# Patient Record
Sex: Male | Born: 1985
Health system: Southern US, Community
[De-identification: ages and names within clinical notes are randomized; demographics above are authoritative.]

## PROBLEM LIST (undated history)

## (undated) ENCOUNTER — Emergency Department: Admission: EM | Payer: 59 | Source: Home / Self Care

## (undated) DIAGNOSIS — I10 Essential (primary) hypertension: Secondary | ICD-10-CM

## (undated) DIAGNOSIS — N390 Urinary tract infection, site not specified: Secondary | ICD-10-CM

## (undated) HISTORY — PX: CYSTOSCOPY: SUR368

---

## 2010-02-07 ENCOUNTER — Emergency Department (HOSPITAL_COMMUNITY)
Admission: EM | Admit: 2010-02-07 | Discharge: 2010-02-07 | Payer: Self-pay | Source: Home / Self Care | Admitting: Family Medicine

## 2010-03-22 ENCOUNTER — Other Ambulatory Visit (HOSPITAL_COMMUNITY): Payer: Self-pay | Admitting: Urology

## 2010-03-24 ENCOUNTER — Ambulatory Visit (HOSPITAL_COMMUNITY)
Admission: RE | Admit: 2010-03-24 | Discharge: 2010-03-24 | Disposition: A | Discharge status: Home / Self Care | Payer: Self-pay | Source: Ambulatory Visit | Attending: Urology | Admitting: Urology

## 2010-03-24 ENCOUNTER — Encounter (HOSPITAL_COMMUNITY): Payer: Self-pay

## 2010-03-24 DIAGNOSIS — Z87442 Personal history of urinary calculi: Secondary | ICD-10-CM | POA: Insufficient documentation

## 2010-03-24 DIAGNOSIS — N281 Cyst of kidney, acquired: Secondary | ICD-10-CM | POA: Insufficient documentation

## 2010-03-24 DIAGNOSIS — R109 Unspecified abdominal pain: Secondary | ICD-10-CM | POA: Insufficient documentation

## 2010-03-24 DIAGNOSIS — R339 Retention of urine, unspecified: Secondary | ICD-10-CM | POA: Insufficient documentation

## 2010-03-24 DIAGNOSIS — R319 Hematuria, unspecified: Secondary | ICD-10-CM | POA: Insufficient documentation

## 2010-03-24 MED ORDER — IOHEXOL 300 MG/ML  SOLN
125.0000 mL | Freq: Once | INTRAMUSCULAR | Status: AC | PRN
  Administered 2010-03-24: 125 mL via INTRAVENOUS

## 2010-04-19 ENCOUNTER — Other Ambulatory Visit (HOSPITAL_COMMUNITY): Payer: Self-pay

## 2010-04-21 ENCOUNTER — Encounter (HOSPITAL_COMMUNITY): Payer: Self-pay | Attending: Urology

## 2010-04-21 ENCOUNTER — Other Ambulatory Visit: Payer: Self-pay | Admitting: Urology

## 2010-04-21 DIAGNOSIS — Z01812 Encounter for preprocedural laboratory examination: Secondary | ICD-10-CM | POA: Insufficient documentation

## 2010-04-21 LAB — CBC
HCT: 42.7 % (ref 39.0–52.0)
Hemoglobin: 14.1 g/dL (ref 13.0–17.0)
MCH: 30.5 pg (ref 26.0–34.0)
MCHC: 33 g/dL (ref 30.0–36.0)
MCV: 92.2 fL (ref 78.0–100.0)

## 2010-04-23 ENCOUNTER — Ambulatory Visit (HOSPITAL_COMMUNITY): Payer: Self-pay

## 2010-04-23 ENCOUNTER — Ambulatory Visit (HOSPITAL_COMMUNITY)
Admission: RE | Admit: 2010-04-23 | Discharge: 2010-04-24 | Disposition: A | Discharge status: Home / Self Care | Payer: Self-pay | Source: Ambulatory Visit | Attending: Urology | Admitting: Urology

## 2010-04-23 ENCOUNTER — Other Ambulatory Visit: Payer: Self-pay | Admitting: Urology

## 2010-04-23 DIAGNOSIS — J9600 Acute respiratory failure, unspecified whether with hypoxia or hypercapnia: Secondary | ICD-10-CM | POA: Insufficient documentation

## 2010-04-23 DIAGNOSIS — Z23 Encounter for immunization: Secondary | ICD-10-CM | POA: Insufficient documentation

## 2010-04-23 DIAGNOSIS — R310 Gross hematuria: Secondary | ICD-10-CM | POA: Insufficient documentation

## 2010-04-23 DIAGNOSIS — J810 Acute pulmonary edema: Secondary | ICD-10-CM

## 2010-04-23 DIAGNOSIS — G4733 Obstructive sleep apnea (adult) (pediatric): Secondary | ICD-10-CM

## 2010-04-23 DIAGNOSIS — N281 Cyst of kidney, acquired: Secondary | ICD-10-CM | POA: Insufficient documentation

## 2010-04-23 DIAGNOSIS — R042 Hemoptysis: Secondary | ICD-10-CM | POA: Insufficient documentation

## 2010-04-23 DIAGNOSIS — N35919 Unspecified urethral stricture, male, unspecified site: Secondary | ICD-10-CM | POA: Insufficient documentation

## 2010-04-23 DIAGNOSIS — Z01812 Encounter for preprocedural laboratory examination: Secondary | ICD-10-CM | POA: Insufficient documentation

## 2010-04-23 DIAGNOSIS — R39198 Other difficulties with micturition: Secondary | ICD-10-CM | POA: Insufficient documentation

## 2010-04-23 LAB — CBC
HCT: 41.2 % (ref 39.0–52.0)
Hemoglobin: 13.9 g/dL (ref 13.0–17.0)
MCH: 30.8 pg (ref 26.0–34.0)
MCHC: 33.7 g/dL (ref 30.0–36.0)
MCV: 91.2 fL (ref 78.0–100.0)

## 2010-04-23 LAB — BASIC METABOLIC PANEL
BUN: 9 mg/dL (ref 6–23)
CO2: 30 mEq/L (ref 19–32)
Chloride: 101 mEq/L (ref 96–112)
Creatinine, Ser: 1.03 mg/dL (ref 0.4–1.5)
GFR calc Af Amer: >60 mL/min (ref >60)

## 2010-04-24 ENCOUNTER — Inpatient Hospital Stay (HOSPITAL_COMMUNITY): Payer: Self-pay

## 2010-04-24 LAB — BASIC METABOLIC PANEL
CO2: 30 mEq/L (ref 19–32)
Calcium: 9.1 mg/dL (ref 8.4–10.5)
Glucose, Bld: 111 mg/dL — ABNORMAL HIGH (ref 70–99)
Sodium: 136 mEq/L (ref 135–145)

## 2010-04-27 NOTE — Op Note (Signed)
  Jerry Butler, Jerry Butler              ACCOUNT NO.:  [id number]  MEDICAL RECORD NO.:  [id number]           PATIENT TYPE:  I  LOCATION:  1421                         FACILITY:  [facility]  PHYSICIAN:  Maretta Bees. Vonita Moss, M.D.DATE OF BIRTH:  Nov 18, 1985  DATE OF PROCEDURE:  04/23/2010 DATE OF DISCHARGE:                              OPERATIVE REPORT   PREOPERATIVE DIAGNOSIS:  Recurrent gross hematuria/urethral bleeding and rule out urethral stricture.  POSTOPERATIVE DIAGNOSIS:  Severe bulbous urethral stricture.  PROCEDURES: 1. Cystoscopy. 2. Urethral mucosal biopsy. 3. Balloon dilation of severe deep bulbous urethral stricture. 4. Placement of Foley catheter.  SURGEON:  Maretta Bees. Vonita Moss, M.D.  ANESTHESIA:  General.  INDICATIONS:  This gentleman has had recurrent initial and terminal hematuria with clots and long history of very small slow urinary stream. He had undergone workup with upper tract studies which showed no significant upper tract lesions contributing to his hematuria.  He did have a benign left renal cyst.  He was told about possibility of a urethral stricture.  He was brought to OR for further evaluation.  DESCRIPTION OF PROCEDURE:  The patient was brought to the operating room and placed in lithotomy position.  External genitalia were prepped and draped in usual fashion.  He was cystoscoped.  Anterior urethra was normal except for mild hypospadias until I reached the deep bulbous urethra and there was no obvious urethral opening.  It almost appeared blind ending and there was some bleeding from this area before I reached it with some mucosal tag from the edge of the urethra.  I took a cold cup bladder biopsy forceps and removed the small mucosal tag for biopsy. I still could not identify obvious urethral opening but with the use of a sensor wire I was able to pass this wire into the bladder.  I used fluoroscopy and confirmed good positioning of the wire and then over  the wire inserted a 5-French open-ended ureteral catheter and injected contrast in what appeared to be a trabeculated bladder.  I then used the balloon dilating equipment to dilate the severe stricture for 3 minutes. I then inserted the flexible cystoscope and the strictured area was 1-2 cm in length.  It was just beyond the external sphincter.  The prostate was small with no obstruction.  The bladder had trabeculation but no intravesical lesions.  With the guidewire still in place, I then inserted a 16-French Council tip Foley catheter and connected to closed drainage after draining clear irrigation from the bladder.  He was taken to recovery room with minimal blood loss having tolerated the procedure well.  He was given Toradol and a B and O suppository at the end of the case.  I wonder whether he had a straddle injury as a child that might account for this severe stricture.     Maretta Bees. Vonita Moss, M.D.     LJP/MEDQ  D:  04/23/2010  T:  04/23/2010  Job:  [id number]  Electronically Signed by Larey Dresser M.D. on 04/27/2010 08:14:09 PM

## 2010-05-06 ENCOUNTER — Inpatient Hospital Stay: Payer: Self-pay | Admitting: Pulmonary Disease

## 2010-05-14 NOTE — Discharge Summary (Signed)
  NAMEANDERSEN, IORIO              ACCOUNT NO.:  [id number]  MEDICAL RECORD NO.:  [id number]           PATIENT TYPE:  O  LOCATION:  1421                         FACILITY:  [facility]  PHYSICIAN:  Maretta Bees. Vonita Moss, M.D.DATE OF BIRTH:  September 27, 1985  DATE OF ADMISSION:  04/23/2010 DATE OF DISCHARGE:  04/24/2010                              DISCHARGE SUMMARY   FINAL DIAGNOSES: 1. Severe urethral stricture. 2. Acute pulmonary dysfunction with possible aspiration.  PROCEDURE:  Cystoscopy and balloon dilation of severe urethral stricture.  HISTORY:  This gentleman has had some hematuria in a very slow urinary stream.  He was brought to the OR for further evaluation.  He was found to have a very tight urethral stricture and underwent balloon dilation. Postoperatively in the recovery room, he had some coughing and pulmonary dysfunction.  He was seen by the pulmonologist to watch him overnight and his pulmonary problems cleared and he was ready for discharge the next day.  He was sent home in good condition.  He will return in the office in 1-2 weeks in followup.  He can resume regular diet and activity.     Maretta Bees. Vonita Moss, M.D.     LJP/MEDQ  D:  05/13/2010  T:  05/14/2010  Job:  [id number]  Electronically Signed by Larey Dresser M.D. on 05/14/2010 08:45:59 AM

## 2012-09-06 IMAGING — CT CT ABD-PEL WO/W CM
2 of 7 series · 14 of 46 positions shown, 19 images · IV contrast (agent unspecified)
Comparison: None.

CLINICAL DATA: Hematuria for 2 months.  For urinary retention.
History of stones seen on the renal ultrasound with a cyst in right
kidney.

CT ABDOMEN AND PELVIS WITHOUT AND WITH CONTRAST
TECHNIQUE: Multidetector CT imaging of the abdomen and pelvis was
performed without contrast material in one or both body regions,
followed by contrast material(s) and further sections in one or
both body regions.
Contrast: 125 ml [id number]-S88

[Series 5: rtn ap with st · axial · 0.74mm/px · z∈[-451,-31]mm · 11 of 100 slices shown, 16 images]
[im 8/100  soft-tissue]
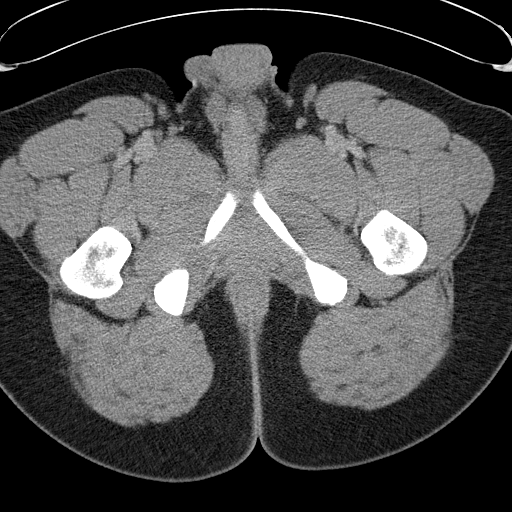
[im 8/100  bone]
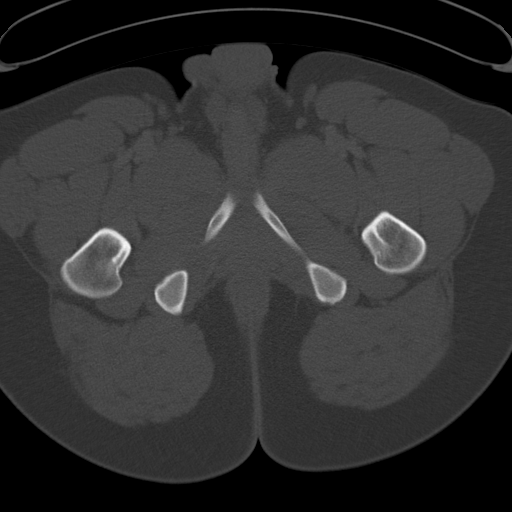
[im 15/100  soft-tissue]
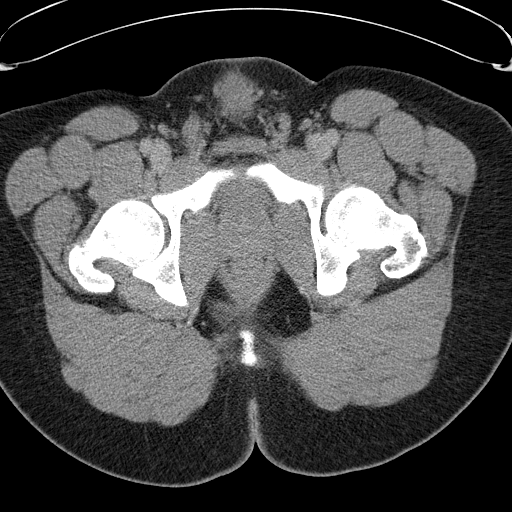
[im 29/100  soft-tissue]
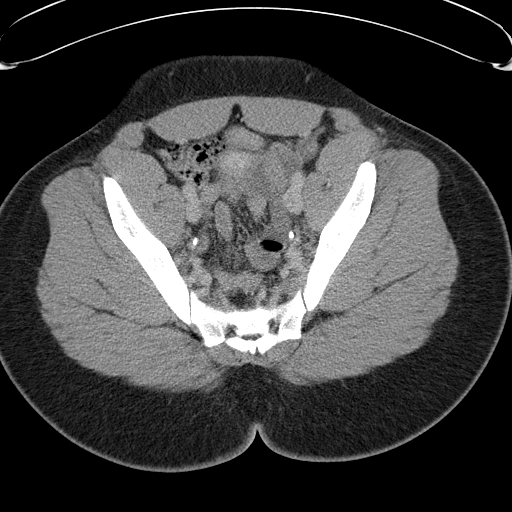
[im 36/100  soft-tissue]
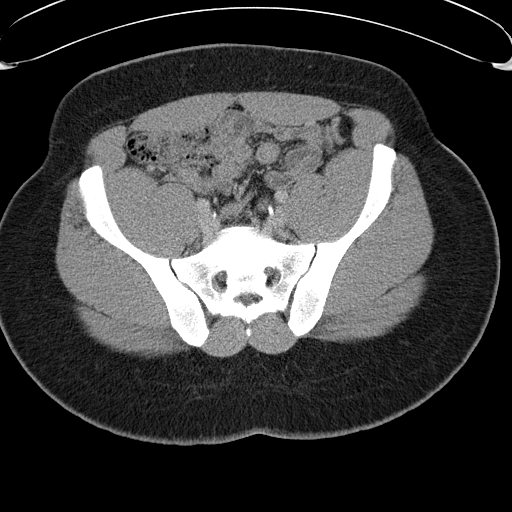
[im 43/100  soft-tissue]
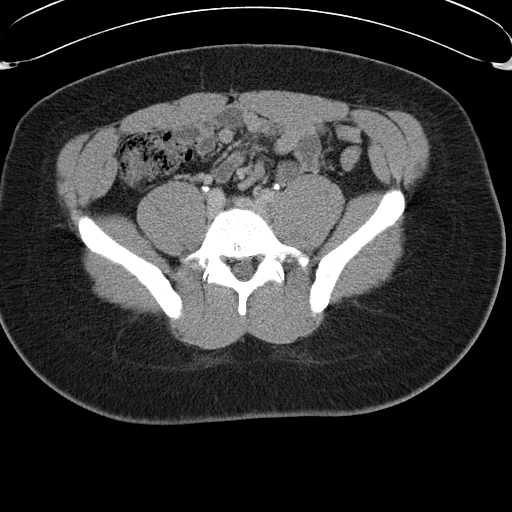
[im 57/100  soft-tissue]
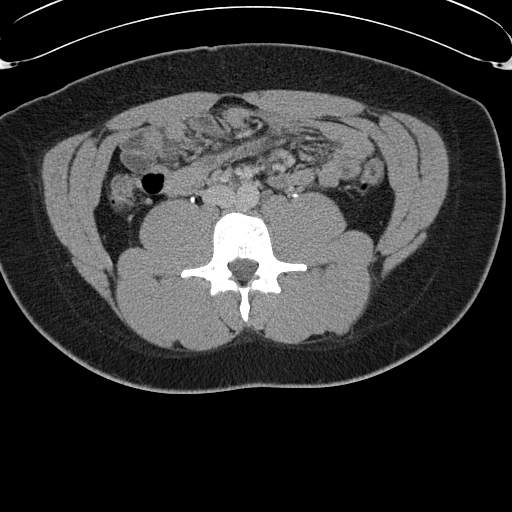
[im 64/100  soft-tissue]
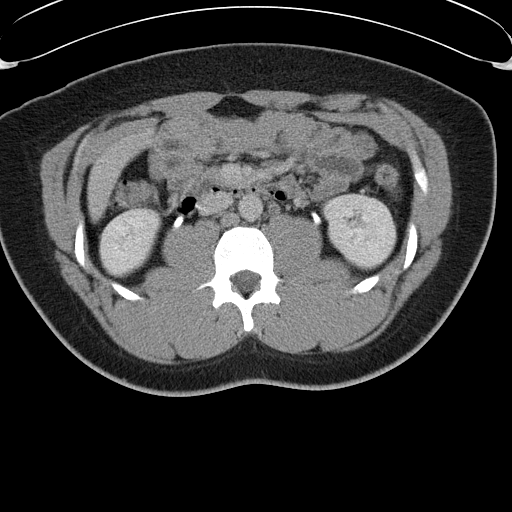
[im 71/100  soft-tissue]
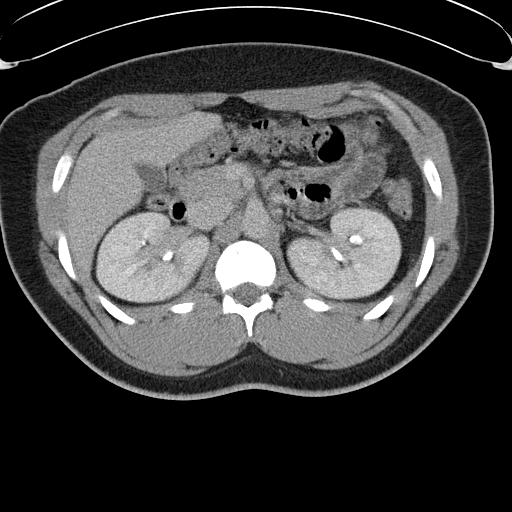
[im 71/100  lung]
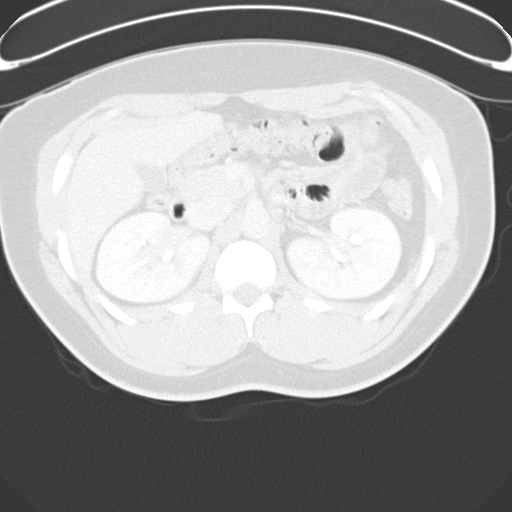
[im 78/100  lung]
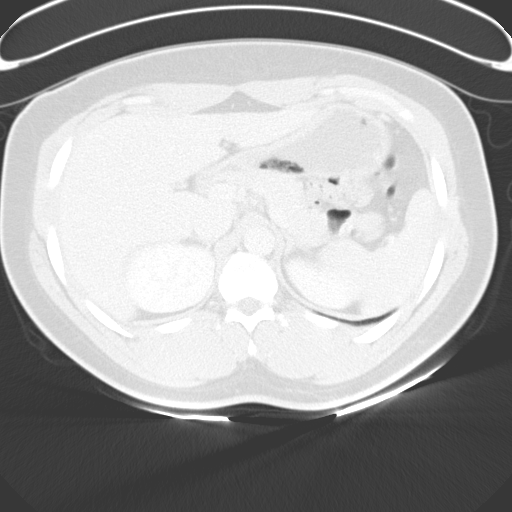
[im 85/100  soft-tissue]
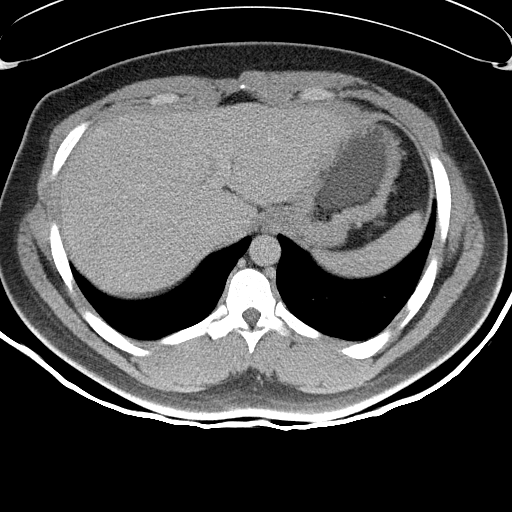
[im 85/100  lung]
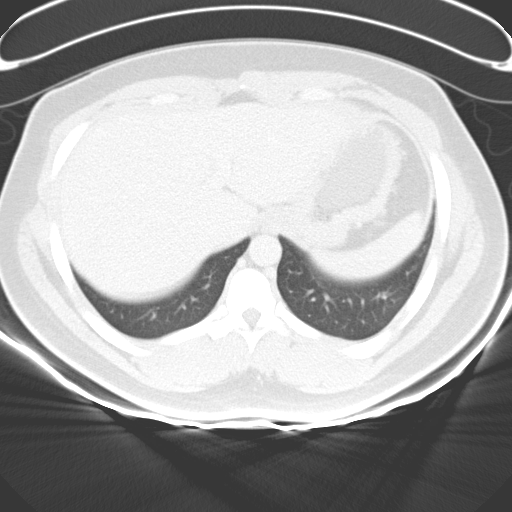
[im 85/100  bone]
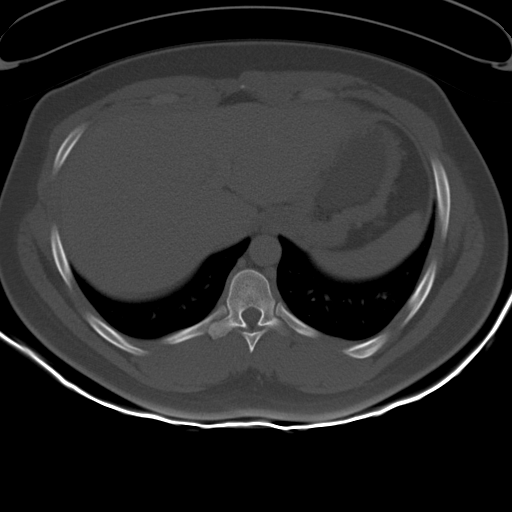
[im 92/100  soft-tissue]
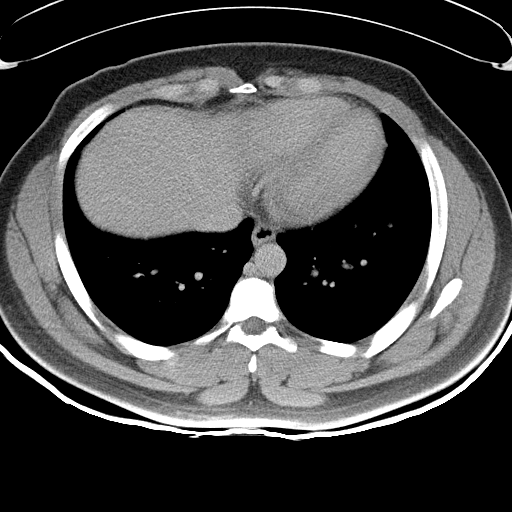
[im 92/100  lung]
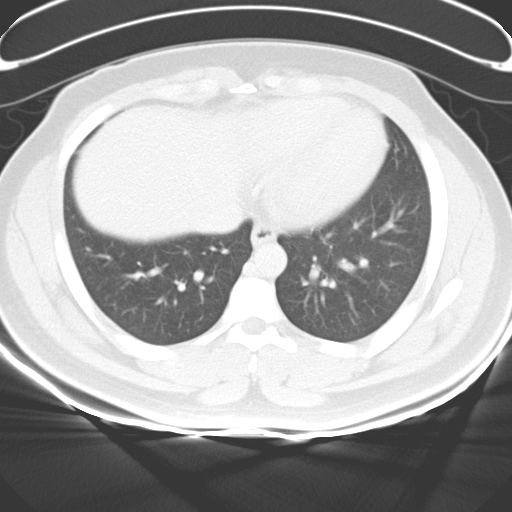

[Series 602: pre coronal · coronal · non-contrast · 0.93mm/px · 3 of 96 slices shown]
[im 24/96  soft-tissue]
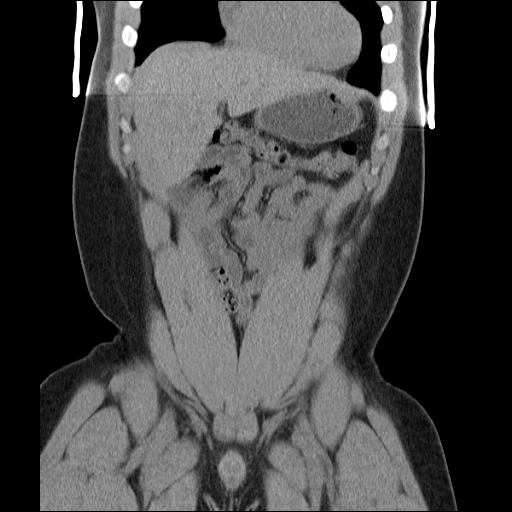
[im 48/96  soft-tissue]
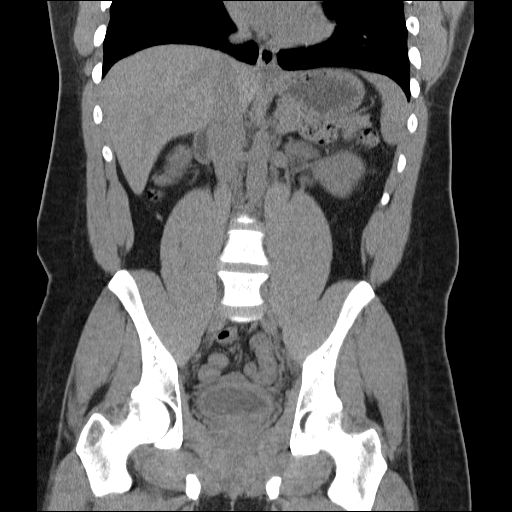
[im 72/96  soft-tissue]
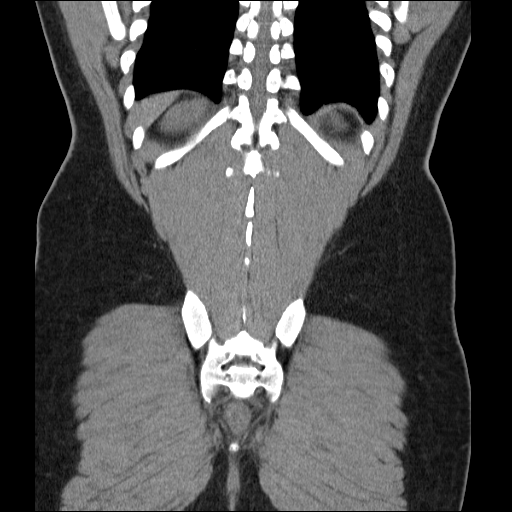

[14 of 46 positions shown; findings below may reference images not displayed]

FINDINGS: Unenhanced images demonstrate no urinary tract calculi or
hydronephrosis/hydroureter.

Postcontrast images demonstrate clear lung bases.  Heart size upper
limits of normal without pericardial or pleural effusions.  Normal
liver, spleen, stomach, pancreas, gallbladder, biliary tract,
adrenal glands.  Interpolar left renal cyst.  Normal right kidney.

Good contrast opacification of the bilateral renal collecting
systems.  Good ureteric opacification.  No filling defect
identified. No retroperitoneal or retrocrural adenopathy.

Normal small bowel without abdominal ascites.

  No pelvic adenopathy.    Normal urinary bladder and prostate.  No
significant free fluid.  No acute osseous abnormality.
IMPRESSION: 1.  No acute process or explanation for hematuria.
2.  Left renal cyst.

## 2012-11-08 ENCOUNTER — Encounter (HOSPITAL_COMMUNITY): Payer: Self-pay | Admitting: Emergency Medicine

## 2012-11-08 ENCOUNTER — Other Ambulatory Visit (HOSPITAL_COMMUNITY)
Admission: RE | Admit: 2012-11-08 | Discharge: 2012-11-08 | Disposition: A | Discharge status: Home / Self Care | Payer: Self-pay | Source: Ambulatory Visit | Attending: Emergency Medicine | Admitting: Emergency Medicine

## 2012-11-08 ENCOUNTER — Emergency Department (INDEPENDENT_AMBULATORY_CARE_PROVIDER_SITE_OTHER)
Admission: EM | Admit: 2012-11-08 | Discharge: 2012-11-08 | Disposition: A | Discharge status: Home / Self Care | Payer: Self-pay | Source: Home / Self Care | Attending: Emergency Medicine | Admitting: Emergency Medicine

## 2012-11-08 DIAGNOSIS — Z113 Encounter for screening for infections with a predominantly sexual mode of transmission: Secondary | ICD-10-CM | POA: Insufficient documentation

## 2012-11-08 DIAGNOSIS — N342 Other urethritis: Secondary | ICD-10-CM

## 2012-11-08 LAB — POCT URINALYSIS DIP (DEVICE)
Glucose, UA: NEGATIVE mg/dL
Nitrite: NEGATIVE
Specific Gravity, Urine: 1.015 (ref 1.005–1.030)
Urobilinogen, UA: 0.2 mg/dL (ref 0.0–1.0)

## 2012-11-08 MED ORDER — AZITHROMYCIN 250 MG PO TABS
1000.0000 mg | ORAL_TABLET | Freq: Once | ORAL | Status: AC
  Administered 2012-11-08: 1000 mg via ORAL

## 2012-11-08 MED ORDER — CIPROFLOXACIN HCL 500 MG PO TABS: 500.0000 mg | ORAL_TABLET | Freq: Two times a day (BID) | ORAL | Status: DC

## 2012-11-08 MED ORDER — CEFTRIAXONE SODIUM 250 MG IJ SOLR
INTRAMUSCULAR | Status: AC
  Filled 2012-11-08: qty 250

## 2012-11-08 MED ORDER — AZITHROMYCIN 250 MG PO TABS
ORAL_TABLET | ORAL | Status: AC
  Filled 2012-11-08: qty 4

## 2012-11-08 MED ORDER — CEFTRIAXONE SODIUM 250 MG IJ SOLR
250.0000 mg | Freq: Once | INTRAMUSCULAR | Status: AC
  Administered 2012-11-08: 250 mg via INTRAMUSCULAR

## 2012-11-08 NOTE — ED Provider Notes (Signed)
Chief Complaint:   Chief Complaint  Patient presents with  . SEXUALLY TRANSMITTED DISEASE    History of Present Illness:   Jerry Butler is a 27 year old male who has had a two-day history of dysuria, urethral discharge, urinary frequency, and urgency. History and is good. He denies any fever, chills, lower back, or lower double pain. He had a cystoscopy in 2012 because of urethral stenosis. His urethra was dilated at that time. He thinks this was done by Dr. Eartha Inch. He's not had any troubles up until the present. The patient is not sexually active.  Review of Systems:  Other than noted above, the patient denies any of the following symptoms: General:  No fevers, chills, sweats, aches, or fatigue. GI:  No abdominal pain, back pain, nausea, vomiting, diarrhea, or constipation. GU:  No dysuria, frequency, urgency, hematuria, urethral discharge, penile lesions, penile pain, testicular pain, swelling, or mass, inguinal lymphadenopathy or incontinence.  PMFSH:  Past medical history, family history, social history, meds, and allergies were reviewed.    Physical Exam:   Vital signs:  BP 132/82  Pulse 93  Temp(Src) 98.3 F (36.8 C) (Oral)  Resp 16  SpO2 100% Gen:  Alert, oriented, in no distress. Lungs:  Clear to auscultation, no wheezes, rales or rhonchi. Heart:  Regular rhythm, no gallop or murmer. Abdomen:  Flat and soft.  No tenderness to palpation, guarding, or rebound.  No hepato-splenomegaly or mass.  Bowel sounds were normally active.  No hernia. Genital exam:  He has some hypospadias. There was no urethral discharge. No lesions on the penis, no inguinal adenopathy, no testicular tenderness. Back:  No CVA tenderness.  Skin:  Clear, warm and dry.  Labs:   Results for orders placed during the hospital encounter of 11/08/12  POCT URINALYSIS DIP (DEVICE)      Result Value Range   Glucose, UA NEGATIVE  NEGATIVE mg/dL   Bilirubin Urine NEGATIVE  NEGATIVE   Ketones, ur NEGATIVE   NEGATIVE mg/dL   Specific Gravity, Urine 1.015  1.005 - 1.030   Hgb urine dipstick NEGATIVE  NEGATIVE   pH 7.0  5.0 - 8.0   Protein, ur NEGATIVE  NEGATIVE mg/dL   Urobilinogen, UA 0.2  0.0 - 1.0 mg/dL   Nitrite NEGATIVE  NEGATIVE   Leukocytes, UA TRACE (*) NEGATIVE    The urine was cultured and DNA probes for gonorrhea, Chlamydia, Trichomonas were obtained.  Course in Urgent Care Center:   He was given Rocephin 250 mg IM and azithromycin 1000 mg by mouth.  Assessment: The encounter diagnosis was Urethritis.   Plan:   1.  Meds:  The following meds were prescribed:   Discharge Medication List as of 11/08/2012 10:00 AM    START taking these medications   Details  ciprofloxacin (CIPRO) 500 MG tablet Take 1 tablet (500 mg total) by mouth every 12 (twelve) hours., Starting 11/08/2012, Until Discontinued, Normal        2.  Patient Education/Counseling:  The patient was given appropriate handouts, self care instructions, and instructed in symptomatic relief.   3.  Follow up:  The patient was told to follow up if no better in 3 to 4 days, if becoming worse in any way, and given some red flag symptoms such as fever or worsening pain with urination which would prompt immediate return.  Follow up with his urologist if needed.      Reuben Likes, MD 11/08/12 2258

## 2012-11-08 NOTE — ED Notes (Signed)
Pt  Reports  Symptoms  Of  Dysuria      As well  As  A  Penile  Discharge     With  The  Symptoms  For  About 1  Week

## 2012-11-09 LAB — URINE CULTURE: Culture: NO GROWTH

## 2013-03-10 ENCOUNTER — Emergency Department (HOSPITAL_COMMUNITY)
Admission: EM | Admit: 2013-03-10 | Discharge: 2013-03-10 | Disposition: A | Discharge status: Home / Self Care | Payer: Self-pay | Attending: Emergency Medicine | Admitting: Emergency Medicine

## 2013-03-10 ENCOUNTER — Encounter (HOSPITAL_COMMUNITY): Payer: Self-pay | Admitting: Emergency Medicine

## 2013-03-10 DIAGNOSIS — Z87440 Personal history of urinary (tract) infections: Secondary | ICD-10-CM | POA: Insufficient documentation

## 2013-03-10 DIAGNOSIS — Z792 Long term (current) use of antibiotics: Secondary | ICD-10-CM | POA: Insufficient documentation

## 2013-03-10 DIAGNOSIS — Z9889 Other specified postprocedural states: Secondary | ICD-10-CM | POA: Insufficient documentation

## 2013-03-10 DIAGNOSIS — R300 Dysuria: Secondary | ICD-10-CM | POA: Insufficient documentation

## 2013-03-10 DIAGNOSIS — R350 Frequency of micturition: Secondary | ICD-10-CM | POA: Insufficient documentation

## 2013-03-10 LAB — URINALYSIS, ROUTINE W REFLEX MICROSCOPIC
BILIRUBIN URINE: NEGATIVE
Glucose, UA: NEGATIVE mg/dL
HGB URINE DIPSTICK: NEGATIVE
KETONES UR: NEGATIVE mg/dL
Nitrite: NEGATIVE
PH: 7 (ref 5.0–8.0)
Protein, ur: NEGATIVE mg/dL
SPECIFIC GRAVITY, URINE: 1.021 (ref 1.005–1.030)
Urobilinogen, UA: 0.2 mg/dL (ref 0.0–1.0)

## 2013-03-10 LAB — URINE MICROSCOPIC-ADD ON

## 2013-03-10 MED ORDER — CIPROFLOXACIN HCL 500 MG PO TABS: 500.0000 mg | ORAL_TABLET | Freq: Two times a day (BID) | ORAL | Status: DC

## 2013-03-10 NOTE — ED Notes (Signed)
C/o hematuria and pain with urination x 2 days.  Pt states he believes he has a UTI.

## 2013-03-10 NOTE — ED Provider Notes (Signed)
CSN: [id number]     Arrival date & time 03/10/13  1950 History  This chart was scribed for non-physician practitioner Rhea Bleacher, PA working with Layla Maw Ward, DO by Elveria Rising, ED Scribe. This patient was seen in room TR07C/TR07C and the patient's care was started at 8:21 PM.   Chief Complaint  Patient presents with  . Hematuria      Patient is a 28 y.o. male presenting with hematuria. The history is provided by the patient. No language interpreter was used.  Hematuria This is a recurrent problem. The current episode started 2 days ago. Pertinent negatives include no chest pain, no abdominal pain and no headaches.   HPI Comments: Jerry Butler is a 28 y.o. male with a history of urethral strictures s/p dilation (2012) -- who presents to the Emergency Department complaining of hematuria, onset 2 days. Patient reports associated frequency and pain with urination. Patient reports that the clots appear both at the beginning and end of his stream. Patient reports trouble ejecting stream when urinating. States that though he is pushing, his stream is weak. Patient shares that hematuria is an recurrent issue and that UTIs are related to the occurrences. His surgery was the last time he visited his urologist. Patient is sexually active. He does not feel he is at risk for STD and does not want treated. Similar problems in 2014, seen at urgent care center, symptoms improved with Cipro.   History reviewed. No pertinent past medical history. Past Surgical History  Procedure Laterality Date  . Cystoscopy     No family history on file. History  Substance Use Topics  . Smoking status: Never Smoker   . Smokeless tobacco: Not on file  . Alcohol Use: Yes    Review of Systems  Constitutional: Negative for fever.  HENT: Negative for rhinorrhea and sore throat.   Eyes: Negative for redness.  Respiratory: Negative for cough.   Cardiovascular: Negative for chest pain.  Gastrointestinal: Negative  for nausea, vomiting, abdominal pain and diarrhea.  Genitourinary: Positive for dysuria, frequency and hematuria. Negative for urgency, discharge, penile swelling and testicular pain.  Musculoskeletal: Negative for myalgias.  Skin: Negative for rash.  Neurological: Negative for headaches.      Allergies  Review of patient's allergies indicates no known allergies.  Home Medications   Current Outpatient Rx  Name  Route  Sig  Dispense  Refill  . ciprofloxacin (CIPRO) 500 MG tablet   Oral   Take 1 tablet (500 mg total) by mouth every 12 (twelve) hours.   20 tablet   0    BP 146/99  Pulse 68  Temp(Src) 99.5 F (37.5 C) (Oral)  Resp 18  Wt 260 lb (117.935 kg)  SpO2 100% Physical Exam  Nursing note and vitals reviewed. Constitutional: He is oriented to person, place, and time. He appears well-developed and well-nourished. No distress.  HENT:  Head: Normocephalic and atraumatic.  Eyes: Conjunctivae and EOM are normal.  Neck: Normal range of motion. Neck supple. No tracheal deviation present.  Cardiovascular: Normal rate.   Pulmonary/Chest: Effort normal. No respiratory distress.  Genitourinary: Penis normal. Uncircumcised. No penile tenderness.  Musculoskeletal: Normal range of motion.  Neurological: He is alert and oriented to person, place, and time.  Skin: Skin is warm and dry.  Psychiatric: He has a normal mood and affect. His behavior is normal.    ED Course  Procedures (including critical care time) DIAGNOSTIC STUDIES: Oxygen Saturation is 100% on room air, normal by my  interpretation.    COORDINATION OF CARE: 8:28 PM- Will order urinalysis. Pt advised of plan for treatment and pt agrees.  Labs Review Labs Reviewed  URINALYSIS, ROUTINE W REFLEX MICROSCOPIC - Abnormal; Notable for the following:    Leukocytes, UA TRACE (*)    All other components within normal limits  URINE CULTURE  GC/CHLAMYDIA PROBE AMP  URINE MICROSCOPIC-ADD ON   Imaging Review No  results found.  EKG Interpretation   None      Vital signs reviewed and are as follows: Filed Vitals:   03/10/13 2144  BP: 133/83  Pulse: 68  Temp:   Resp: 20   UA is not impressive, however 3-6 WBC. Similar findings last visit, culture did not grow anything. Given that patient is higher risk for UTI, will treat. Cx sent. Test for GC/chlamydia sent.   Urged followup with urology given recurrent symptoms.  MDM   Final diagnoses:  Dysuria   Patient with dysuria, hematuria, UA showing a few white blood cells. No systemic symptoms of illness. Do not suspect pyelo. Will treat as complicated UTI.  I personally performed the services described in this documentation, which was scribed in my presence. The recorded information has been reviewed and is accurate.    Renne CriglerJoshua Granvel Proudfoot, PA-C 03/10/13 2205

## 2013-03-10 NOTE — Discharge Instructions (Signed)
Please read and follow all provided instructions.  Your diagnoses today include:  1. Dysuria    Tests performed today include:  Urine test - shows some white blood cells but is not overwhelming for infection in your bladder  Vital signs. See below for your results today.   Medications prescribed:   Ciprofloxacin - antibiotic  You have been prescribed an antibiotic medicine: take the entire course of medicine even if you are feeling better. Stopping early can cause the antibiotic not to work.  Home care instructions:  Follow any educational materials contained in this packet.  Follow-up instructions: Please follow-up with your urologist in the next week.  If you do not have a primary care doctor -- see below for referral information.   Return instructions:   Please return to the Emergency Department if you experience worsening symptoms.   Return with fever, worsening pain, persistent vomiting, worsening pain in your back.   Please return if you have any other emergent concerns.  Additional Information:  Your vital signs today were: BP 146/99   Pulse 68   Temp(Src) 99.5 F (37.5 C) (Oral)   Resp 18   Wt 260 lb (117.935 kg)   SpO2 100% If your blood pressure (BP) was elevated above 135/85 this visit, please have this repeated by your doctor within one month. --------------  Emergency Department Resource Guide 1) Find a Doctor and Pay Out of Pocket Although you won't have to find out who is covered by your insurance plan, it is a good idea to ask around and get recommendations. You will then need to call the office and see if the doctor you have chosen will accept you as a new patient and what types of options they offer for patients who are self-pay. Some doctors offer discounts or will set up payment plans for their patients who do not have insurance, but you will need to ask so you aren't surprised when you get to your appointment.  2) Contact Your Local Health  Department Not all health departments have doctors that can see patients for sick visits, but many do, so it is worth a call to see if yours does. If you don't know where your local health department is, you can check in your phone book. The CDC also has a tool to help you locate your state's health department, and many state websites also have listings of all of their local health departments.  3) Find a Walk-in Clinic If your illness is not likely to be very severe or complicated, you may want to try a walk in clinic. These are popping up all over the country in pharmacies, drugstores, and shopping centers. They're usually staffed by nurse practitioners or physician assistants that have been trained to treat common illnesses and complaints. They're usually fairly quick and inexpensive. However, if you have serious medical issues or chronic medical problems, these are probably not your best option.  No Primary Care Doctor: - Call Health Connect at  [phone] - they can help you locate a primary care doctor that  accepts your insurance, provides certain services, etc. - Physician Referral Service- [phone]  Chronic Pain Problems: Organization         Address  Phone   Notes  [facility]  [phone] Patients need to be referred by their primary care doctor.   Medication Assistance: Organization         Address  Phone   Notes  [facility] Medication Assistance  Program [address] [place], [place] [address] [phone]) [phone] --Must be a resident of [place] -- Must have NO insurance coverage whatsoever (no Medicaid/ Medicare, etc.) -- The pt. MUST have a primary care doctor that directs their care regularly and follows them in the community   Med[facility]ist  [phone]) [phone]   [employer]  [phone]) [phone]    Agencies that provide inexpensive medical care: Organization         Address  Phone   Notes  [facility]  [phone]   [facility]    [phone]) [phone]   [facility] [address] [place], [place] [address] [phone]) [phone]   [facility] [place] [address], [place] [phone]) [phone]   [facility]anned [facility]    [phone](336) [phone]   [facility]    [phone]) [phone]   [facility]  [address], [place] Phone:  [phone]) [phone], Fax:  [phone] Hours of Operation:  9 am - 6 pm, M-F.  Also accepts Medicaid/Medicare and self-pay.  [facility]  [address], [place] Phone: [phone](336) [phone], Fax: [phone]. Hours of Operation:  8:30 am - 5:30 pm, M-F.  Also accepts Medicaid and self-pay.  [facility] [place] [address], [place] Phone: [phone](336) [phone]   [facility] [address], [place], [place] [phone], Ext. [phone] Mondays & Thursdays: 7-9 AM.  First 15 patients are seen on a first come, first serve basis.    Medicaid-accepting [place] Providers:  Organization         Address  Phone   Notes  [facility] [address], [place] [phone] Also accepts self-pay patients.  [facility] [address] [place] [address] [place], [place]  [phone]) [phone]   [facility] [address], [place] [phone](336) [phone]   [facility] [address] [place] [address], [place] [phone]) [phone]   Renaye RakersVeita Bland [address], [place]   [phone]) [phone] Only accepts [place] Access [place] patients after they have their name applied to their card.   Self-Pay (no insurance) in [facility] [place]:  Organization         Address  Phone   Notes  Sickle Cell Patients, [facility] [address], [place] [phone]) [phone]   [facility] [address], [place] [phone]) [phone]   [facility] [place]  [address],  [place] [phone]) [phone]   [facility]/Dr. Osei-Bonsu  [address] [place] [address], [place] or [phone], [place] [phone] Phone number for both [place] and [place] locations is the same.  [facility] [address], [place] [phone]) [phone]   [facility] [address] [place] [address], [place] or [address] [phone]) [phone]) [phone]   [facility] [address], [place] [phone]) [phone], phone; [phone](336) [phone], fax Sees patients 1st and 3rd Saturday of every month.  Must not qualify for public or private insurance (i.e. Medicaid, Medicare, [place] Health Choice, Veterans' Benefits)  Household income should be no more than 200% of the poverty level The clinic cannot treat you if you are pregnant or think you are pregnant  Sexually transmitted diseases are not treated at the  clinic.    Dental Care: Organization         Address  Phone  Notes  [facility] Department of [facility] [address] [place], [place] [phone] Accepts children up to age 59 who are enrolled in [place] or [place] Health Choice; pregnant women with a Medicaid card; and children who have applied for Medicaid or [place] Health Choice, but were declined, whose parents can pay a reduced fee at time of service.  [facility] Department of [facility]  [address], [place] [phone] Accepts children up to age 60 who are enrolled in [place] or [place] Health Choice; pregnant women with a Medicaid card; and children who have applied for Medicaid or [place] Health Choice, but were declined, whose parents can pay a reduced fee at time of service.  [facility]  [address] [place], [place] [phone] Patients are seen by appointment only. Walk-ins are not accepted. [facility] will see patients 34 years of age and older. Monday - Tuesday (8am-5pm) Most Wednesdays  (8:30-5pm) $30 per visit, cash only  [facility] Adult Dental Access PROGRAM  [address], [facility] [phone] Patients are seen by appointment only. Walk-ins are not accepted. [facility] will see patients 25 years of age and older. One Wednesday Evening (Monthly: Volunteer Based).  $30 per visit, cash only  [employer]  [phone] for adults; Children under age 45, call Graduate Pediatric Dentistry at [phone]. Children aged 60-14, please call [phone] to request a pediatric application.  Dental services are provided in all areas of dental care including fillings, crowns and bridges, complete and partial dentures, implants, gum treatment, root canals, and extractions. Preventive care is also provided. Treatment is provided to both adults and children. Patients are selected via a lottery and there is often a waiting list.   [facility] [address] [place]  [phone] [email or web address]   [facility] [address] [place], [place] [phone], Ext. [phone] Second and Fourth Thursday of each month, opens at 6:30 AM; Clinic ends at 9 AM.  Patients are seen on a first-come first-served basis, and a limited number are seen during each clinic.   [facility]  [address] [place], [place] [phone]   Eligibility Requirements You must have lived in [place], [place], or [place] counties for at least the last three months.   You cannot be eligible for state or federal sponsored [place], including [employer], [place], or [employer].   You generally cannot be eligible for healthcare insurance through your employer.    How to apply: Eligibility screenings are held every Tuesday and Wednesday afternoon from 1:00 pm until 4:00 pm. You do not need an appointment for the interview!  [facility] [address] [place], [place] [phone]   [facility]  Health Department  [phone]   [facility] Health Department  [phone]   [facility] Health Department  [phone]    Behavioral Health Resources in the Community: Intensive Outpatient Programs Organization         Address  Phone  Notes  [facility] Services [address] [place], [place] [phone]   [facility] Outpatient [address] [place], [place] [phone]   [facility] [address], [place], [place]  [phone]   [facility] Mental Health  [address]  [place], [place] [phone] or [phone]   Substance Abuse Resources Organization         Address  Phone  Notes  Alcohol [facility] Drug Services  [phone]   [facility]  [phone]   [facility]  [phone]   Floydene Flock  [phone]   Residential & Outpatient Substance Abuse Program  [phone]   Psychological Services Organization         Address  Phone  Notes  [facility] Health  [phone]   [facility] Services  [phone]   [facility] [address], [place] [phone] or [phone]    Mobile Crisis Teams Organization         Address  Phone  Notes  [facility], Mobile Crisis Care Unit  [phone]   [facility]  [address] [place], [place] [phone]   Doristine Locks [address] [place] [phone]    Self-Help/Support Groups Organization         Address  Phone             Notes  [facility] [place] - variety of support groups  336- I7437963 Call for more information  Narcotics Anonymous (NA), [facility] [address], [employer] B and E  2 meetings at this location   Statistician         Address  Phone  Notes  [facility] [address],    [place]  [phone]   [facility]  [address] [place] [address], [place], [place]  [phone]   [facility] [address] [place], [place] [phone] Admissions: 8am-3pm M-F  [facility] [address]    [place], [place] [phone]   [facility] [address] [place], [place], [place] [phone]   [facility] [address],  [place], [place] [phone]   [facility] - Intensive Outpatient [address], [place], [place] [phone]   [facility] (Add[facility] [address]  [place], [place] [phone] or [phone]   [facility] ([facility]) [address], [place], [place] [phone] Accepts Medicaid  [facility] [address]  [place] [phone] Substance Abuse/Addiction Treatment   [facility] Organization         Address  Phone  Notes  [facility]  [phone]   Angie Fava, PhD [address]vin Kn[address] [place], [place]   (343)[phone] or ([phone]   [facility] Behavioral   [address] [place], [place] [phone]   [facility] [address] [place], [place] [phone] Insurance/Medicaid/sponsorship through [facility] and Families [address]                                    [place], [place] [phone] Therapy/tele-psych/case  [facility] [address], [place] [phone]    Dr. Lolly Mustache  [phone]   [facility]  [employer] [facility] Dept. 1) [address]  2) [address], [place] 3)  [address], [place] [phone]  [phone]   [facility] Child Abuse Hotline 701 310 8164 or [phone] (After Hours)

## 2013-03-11 LAB — URINE CULTURE
COLONY COUNT: NO GROWTH
CULTURE: NO GROWTH

## 2013-03-11 NOTE — ED Provider Notes (Signed)
Medical screening examination/treatment/procedure(s) were performed by non-physician practitioner and as supervising physician I was immediately available for consultation/collaboration.  EKG Interpretation   None         Devynn Scheff N Donovin Kraemer, DO 03/11/13 0211 

## 2013-05-04 ENCOUNTER — Encounter (HOSPITAL_COMMUNITY): Payer: Self-pay | Admitting: Emergency Medicine

## 2013-05-04 ENCOUNTER — Emergency Department (HOSPITAL_COMMUNITY)
Admission: EM | Admit: 2013-05-04 | Discharge: 2013-05-04 | Disposition: A | Discharge status: Home / Self Care | Payer: Self-pay | Attending: Emergency Medicine | Admitting: Emergency Medicine

## 2013-05-04 DIAGNOSIS — Z9889 Other specified postprocedural states: Secondary | ICD-10-CM | POA: Insufficient documentation

## 2013-05-04 DIAGNOSIS — R300 Dysuria: Secondary | ICD-10-CM | POA: Insufficient documentation

## 2013-05-04 HISTORY — DX: Urinary tract infection, site not specified: N39.0

## 2013-05-04 LAB — URINALYSIS, ROUTINE W REFLEX MICROSCOPIC
BILIRUBIN URINE: NEGATIVE
Glucose, UA: NEGATIVE mg/dL
Hgb urine dipstick: NEGATIVE
Ketones, ur: NEGATIVE mg/dL
Leukocytes, UA: NEGATIVE
Nitrite: NEGATIVE
PH: 6.5 (ref 5.0–8.0)
PROTEIN: NEGATIVE mg/dL
Specific Gravity, Urine: 1.025 (ref 1.005–1.030)
Urobilinogen, UA: 0.2 mg/dL (ref 0.0–1.0)

## 2013-05-04 MED ORDER — PHENAZOPYRIDINE HCL 200 MG PO TABS: 200.0000 mg | ORAL_TABLET | Freq: Three times a day (TID) | ORAL | Status: DC

## 2013-05-04 NOTE — Discharge Instructions (Signed)
Follow-up with a Urologist as soon as possible for further evaluation.  Return to the emergency department if you have concern for worsening symptoms.

## 2013-05-04 NOTE — ED Notes (Signed)
Pt not in room.

## 2013-05-04 NOTE — ED Notes (Signed)
Pt reports frequent urination, straining to urinate, burning with urination and seeing blood clots for past couple of days. Pt denies abdominal or back pain.

## 2013-05-04 NOTE — ED Notes (Signed)
Unable to obtain e-signature due to signature pad not working.  Pt verbalized understanding of d/c and f/u.  No additional questions regarding d/c.

## 2013-05-04 NOTE — ED Notes (Signed)
Pt c/o difficulty starting to urinate, sts it started "a while ago" but it comes and goes, about 3 days ago it started up again. sts he was dx with a urinary stricture a few years ago and was told he may have to have another sx at some point. sts he used to see a urologist but thinks he is retired now. Pt reports he has been seeing blood and sometimes blood clots coming out of his urethra sometimes before and sometimes after his urinates. Pt reports he is sexually active, uses protection and doesn't suspect and STD. Reports he is having some pain with urination. Denies abd pain/back pain. Nad, skin warm and dry, resp e/u.

## 2013-05-04 NOTE — ED Provider Notes (Signed)
CSN: [id number]     Arrival date & time 05/04/13  1757 History   First MD Initiated Contact with Patient 05/04/13 1858     Chief Complaint  Patient presents with  . Hematuria     (Consider location/radiation/quality/duration/timing/severity/associated sxs/prior Treatment) The history is provided by the patient.   history of present illness: 28 year old male who presents with chief complaint of hematuria. Onset of symptoms was several days ago. This has been a recurring problem for the patient since the age of 89. He has history of urethral stricture and had dilation performed in 2012. He has not had problems since that time. Patient reports hesitancy and stinging pain with urination. Episodes occur intermittently with each urination. Pain rated moderate severity. Sexually active with one partner and has no concern for sexually transmitted infection. He has associated episodes of "dark brown clots" in his urine that he is concerned are blood. No history of fevers, flank pain, or prior renal stones. Reports symptoms similar to previous episodes when he required dilation. No urinary retention.  Past Medical History  Diagnosis Date  . UTI (urinary tract infection)    Past Surgical History  Procedure Laterality Date  . Cystoscopy     No family history on file. History  Substance Use Topics  . Smoking status: Never Smoker   . Smokeless tobacco: Not on file  . Alcohol Use: No    Review of Systems  Constitutional: Negative for fever and chills.  HENT: Negative.   Eyes: Negative.   Respiratory: Negative for cough and shortness of breath.   Cardiovascular: Negative for chest pain and palpitations.  Gastrointestinal: Negative for nausea, vomiting, abdominal pain, diarrhea and constipation.  Genitourinary: Positive for dysuria, hematuria and difficulty urinating. Negative for urgency, discharge, scrotal swelling and testicular pain.  Musculoskeletal: Negative.   Skin: Negative.    Neurological: Negative.   All other systems reviewed and are negative.     Allergies  Review of patient's allergies indicates no known allergies.  Home Medications   Current Outpatient Rx  Name  Route  Sig  Dispense  Refill  . phenazopyridine (PYRIDIUM) 200 MG tablet   Oral   Take 1 tablet (200 mg total) by mouth 3 (three) times daily.   15 tablet   0    BP 129/95  Pulse 71  Temp(Src) 99.4 F (37.4 C) (Oral)  Resp 18  Ht 5\' 10"  (1.778 m)  Wt 270 lb (122.471 kg)  BMI 38.74 kg/m2  SpO2 97% Physical Exam  Nursing note and vitals reviewed. Constitutional: He is oriented to person, place, and time. He appears well-developed and well-nourished. No distress.  HENT:  Head: Normocephalic and atraumatic.  Eyes: Conjunctivae are normal.  Neck: Neck supple.  Cardiovascular: Normal rate, regular rhythm, normal heart sounds and intact distal pulses.   Pulmonary/Chest: Effort normal and breath sounds normal. He has no wheezes. He has no rales.  Abdominal: Soft. He exhibits no distension. There is no tenderness.  Genitourinary: Testes normal and penis normal.  Musculoskeletal: Normal range of motion.  Neurological: He is alert and oriented to person, place, and time.  Skin: Skin is warm and dry.    ED Course  Procedures (including critical care time) Labs Review Labs Reviewed  URINALYSIS, ROUTINE W REFLEX MICROSCOPIC   Imaging Review No results found.   EKG Interpretation None      MDM   Final diagnoses:  Dysuria    28 year old male with history of urethral stricture who presents with several days  of increasing frequent urination, hesitancy, burning with urination, and concern for passing blood clots in urine. Normal vital signs.  Symptoms consistent with prior urethral stricture. Urinalysis without evidence of hematuria or infection. No concern for sexually transmitted infection. No signs of obstruction or urinary attention.  Patient appropriate for discharge  with close urology followup. We will give course of Pyridium as patient reports this helped his symptoms in the past.    Cherre RobinsBryan Clatie Kessen, MD 05/04/13 2330

## 2013-05-09 NOTE — ED Provider Notes (Signed)
I saw and evaluated the patient, reviewed the resident's note and I agree with the findings and plan.   .Face to face Exam:  General:  Awake HEENT:  Atraumatic Resp:  Normal effort Abd:  Nondistended Neuro:No focal weakness    Nelia Shiobert L Tyriek Hofman, MD 05/09/13 [phone]

## 2017-11-21 ENCOUNTER — Ambulatory Visit (HOSPITAL_COMMUNITY)
Admission: EM | Admit: 2017-11-21 | Discharge: 2017-11-21 | Disposition: A | Discharge status: Home / Self Care | Payer: BLUE CROSS/BLUE SHIELD | Attending: Nurse Practitioner | Admitting: Nurse Practitioner

## 2017-11-21 ENCOUNTER — Encounter (HOSPITAL_COMMUNITY): Payer: Self-pay | Admitting: Emergency Medicine

## 2017-11-21 DIAGNOSIS — J0100 Acute maxillary sinusitis, unspecified: Secondary | ICD-10-CM | POA: Diagnosis not present

## 2017-11-21 MED ORDER — METHYLPREDNISOLONE 4 MG PO TBPK: ORAL_TABLET | ORAL | 0 refills | Status: DC

## 2017-11-21 MED ORDER — CEFDINIR 300 MG PO CAPS: 300.0000 mg | ORAL_CAPSULE | Freq: Two times a day (BID) | ORAL | 0 refills | Status: AC

## 2017-11-21 NOTE — ED Triage Notes (Signed)
PT reports congestion, drainage, facial pain, coughing up green congestion.

## 2017-11-21 NOTE — ED Provider Notes (Signed)
[facility]    CSN: [id number] Arrival date & time: 11/21/17  1946     History   Chief Complaint Chief Complaint  Patient presents with  . Nasal Congestion    HPI Jerry Butler is a 32 y.o. male.   Subjective:   Jerry Butler is a 32 y.o. male who presents for evaluation of possible sinus infection. Symptoms include bilateral ear pressure, congestion, non-productive cough, headache, nasal congestion, post nasal drip, purulent nasal discharge, sinus pressure and sore throat with no fever, chills, night sweats or weight loss. Onset of symptoms was 1 week ago and has been unchanged since that time.  He is drinking plenty of fluids.  Past history is significant for no history of pneumonia or bronchitis. Patient is a non-smoker.  The following portions of the patient's history were reviewed and updated as appropriate: allergies, current medications, past family history, past medical history, past social history, past surgical history and problem list.       Past Medical History:  Diagnosis Date  . UTI (urinary tract infection)     There are no active problems to display for this patient.   Past Surgical History:  Procedure Laterality Date  . CYSTOSCOPY         Home Medications    Prior to Admission medications   Medication Sig Start Date End Date Taking? Authorizing Provider  cefdinir (OMNICEF) 300 MG capsule Take 1 capsule (300 mg total) by mouth 2 (two) times daily for 7 days. 11/21/17 11/28/17  Lurline Idol, FNP  methylPREDNISolone (MEDROL DOSEPAK) 4 MG TBPK tablet Take as directed 11/21/17   Lurline Idol, FNP    Family History No family history on file.  Social History Social History   Tobacco Use  . Smoking status: Never Smoker  Substance Use Topics  . Alcohol use: No  . Drug use: No     Allergies   Patient has no known allergies.   Review of Systems Review of Systems  Constitutional: Negative for fever.  HENT: Positive  for congestion, postnasal drip, rhinorrhea, sinus pressure and sore throat. Negative for trouble swallowing.   Eyes: Negative for discharge, redness and itching.  Respiratory: Positive for cough. Negative for shortness of breath.   Gastrointestinal: Negative for nausea and vomiting.  Neurological: Positive for headaches.  All other systems reviewed and are negative.    Physical Exam Triage Vital Signs ED Triage Vitals [11/21/17 1958]  Enc Vitals Group     BP (!) 174/107     Pulse Rate 83     Resp 16     Temp 98.5 F (36.9 C)     Temp Source Oral     SpO2 97 %     Weight      Height      Head Circumference      Peak Flow      Pain Score 4     Pain Loc      Pain Edu?      Excl. in GC?    No data found.  Updated Vital Signs BP (!) 174/107   Pulse 83   Temp 98.5 F (36.9 C) (Oral)   Resp 16   SpO2 97%   Visual Acuity Right Eye Distance:   Left Eye Distance:   Bilateral Distance:    Right Eye Near:   Left Eye Near:    Bilateral Near:     Physical Exam   UC Treatments / Results  Labs (all labs ordered are  listed, but only abnormal results are displayed) Labs Reviewed - No data to display  EKG None  Radiology No results found.  Procedures Procedures (including critical care time)  Medications Ordered in UC Medications - No data to display  Initial Impression / Assessment and Plan / UC Course  I have reviewed the triage vital signs and the nursing notes.  Pertinent labs & imaging results that were available during my care of the patient were reviewed by me and considered in my medical decision making (see chart for details).     32 year old male presenting with an acute maxillary sinusitis.  Plan:  1. Medrol dose pack as directed  2. Cefdinir 300 mg BID x 7 days  3. Nasacort daily 4. Mucinex D twice daily  5. Drink plenty of fluids  6. Follow-up if symptoms worsen or persist.   Today's evaluation has revealed no signs of a dangerous  process. Discussed diagnosis with patient. Patient aware of their diagnosis, possible red flag symptoms to watch out for and need for close follow up. Patient understands verbal and written discharge instructions. Patient comfortable with plan and disposition.  Patient has a clear mental status at this time, good insight into illness (after discussion and teaching) and has clear judgment to make decisions regarding their care.  Documentation was completed with the aid of voice recognition software. Transcription may contain typographical errors. Final Clinical Impressions(s) / UC Diagnoses   Final diagnoses:  Acute non-recurrent maxillary sinusitis     Discharge Instructions     Take medications as prescribed. Also start Nasacort 2 sprays per nostril daily as well as Mucinex-D twice daily. The Mucinex is located behind the pharmacy counter and you must ask the pharmacy staff for this medication. Drink plenty of fluids.    ED Prescriptions    Medication Sig Dispense Auth. Provider   cefdinir (OMNICEF) 300 MG capsule Take 1 capsule (300 mg total) by mouth 2 (two) times daily for 7 days. 14 capsule Lurline Idol, FNP   methylPREDNISolone (MEDROL DOSEPAK) 4 MG TBPK tablet Take as directed 21 tablet Lurline Idol, FNP     Controlled Substance Prescriptions [place] Controlled Substance Registry consulted? Not Applicable   Lurline Idol, [facility] 11/21/17 2037

## 2017-11-21 NOTE — Discharge Instructions (Signed)
Take medications as prescribed. Also start Nasacort 2 sprays per nostril daily as well as Mucinex-D twice daily. The Mucinex is located behind the pharmacy counter and you must ask the pharmacy staff for this medication. Drink plenty of fluids.

## 2019-01-01 DIAGNOSIS — J3089 Other allergic rhinitis: Secondary | ICD-10-CM | POA: Diagnosis not present

## 2019-01-01 DIAGNOSIS — Z7289 Other problems related to lifestyle: Secondary | ICD-10-CM | POA: Diagnosis not present

## 2019-01-01 DIAGNOSIS — J300 Vasomotor rhinitis: Secondary | ICD-10-CM | POA: Diagnosis not present

## 2019-01-22 DIAGNOSIS — I10 Essential (primary) hypertension: Secondary | ICD-10-CM | POA: Diagnosis not present

## 2019-02-19 DIAGNOSIS — Z7182 Exercise counseling: Secondary | ICD-10-CM | POA: Diagnosis not present

## 2019-02-19 DIAGNOSIS — I10 Essential (primary) hypertension: Secondary | ICD-10-CM | POA: Diagnosis not present

## 2019-02-19 DIAGNOSIS — Z6841 Body Mass Index (BMI) 40.0 and over, adult: Secondary | ICD-10-CM | POA: Diagnosis not present

## 2019-02-19 DIAGNOSIS — Z0000 Encounter for general adult medical examination without abnormal findings: Secondary | ICD-10-CM | POA: Diagnosis not present

## 2019-02-19 DIAGNOSIS — Z13220 Encounter for screening for lipoid disorders: Secondary | ICD-10-CM | POA: Diagnosis not present

## 2019-02-19 DIAGNOSIS — Z113 Encounter for screening for infections with a predominantly sexual mode of transmission: Secondary | ICD-10-CM | POA: Diagnosis not present

## 2019-02-19 DIAGNOSIS — Z713 Dietary counseling and surveillance: Secondary | ICD-10-CM | POA: Diagnosis not present

## 2019-03-06 DIAGNOSIS — R7989 Other specified abnormal findings of blood chemistry: Secondary | ICD-10-CM | POA: Diagnosis not present

## 2019-03-15 ENCOUNTER — Emergency Department (HOSPITAL_COMMUNITY)
Admission: EM | Admit: 2019-03-15 | Discharge: 2019-03-15 | Disposition: A | Discharge status: Home / Self Care | Payer: 59 | Attending: Emergency Medicine | Admitting: Emergency Medicine

## 2019-03-15 ENCOUNTER — Encounter (HOSPITAL_COMMUNITY): Payer: Self-pay

## 2019-03-15 ENCOUNTER — Emergency Department (HOSPITAL_COMMUNITY): Payer: 59

## 2019-03-15 ENCOUNTER — Other Ambulatory Visit: Payer: Self-pay

## 2019-03-15 DIAGNOSIS — I10 Essential (primary) hypertension: Secondary | ICD-10-CM | POA: Insufficient documentation

## 2019-03-15 DIAGNOSIS — Z20822 Contact with and (suspected) exposure to covid-19: Secondary | ICD-10-CM | POA: Insufficient documentation

## 2019-03-15 DIAGNOSIS — R0789 Other chest pain: Secondary | ICD-10-CM | POA: Diagnosis not present

## 2019-03-15 DIAGNOSIS — R079 Chest pain, unspecified: Secondary | ICD-10-CM | POA: Diagnosis not present

## 2019-03-15 HISTORY — DX: Essential (primary) hypertension: I10

## 2019-03-15 LAB — BASIC METABOLIC PANEL
Anion gap: 11 (ref 5–15)
BUN: 10 mg/dL (ref 6–20)
CO2: 26 mmol/L (ref 22–32)
Calcium: 9.7 mg/dL (ref 8.9–10.3)
Chloride: 101 mmol/L (ref 98–111)
Creatinine, Ser: 1.03 mg/dL (ref 0.61–1.24)
GFR calc Af Amer: >60 mL/min (ref >60)
GFR calc non Af Amer: >60 mL/min (ref >60)
Glucose, Bld: 94 mg/dL (ref 70–99)
Potassium: 3.4 mmol/L — ABNORMAL LOW (ref 3.5–5.1)
Sodium: 138 mmol/L (ref 135–145)

## 2019-03-15 LAB — TROPONIN I (HIGH SENSITIVITY)
Troponin I (High Sensitivity): 4 ng/L (ref <18)
Troponin I (High Sensitivity): 3 ng/L (ref <18)

## 2019-03-15 LAB — CBC
HCT: 39 % (ref 39.0–52.0)
Hemoglobin: 12.9 g/dL — ABNORMAL LOW (ref 13.0–17.0)
MCH: 30.1 pg (ref 26.0–34.0)
MCHC: 33.1 g/dL (ref 30.0–36.0)
MCV: 91.1 fL (ref 80.0–100.0)
Platelets: 380 10*3/uL (ref 150–400)
RBC: 4.28 MIL/uL (ref 4.22–5.81)
RDW: 13.2 % (ref 11.5–15.5)
WBC: 10.7 10*3/uL — ABNORMAL HIGH (ref 4.0–10.5)
nRBC: 0 % (ref 0.0–0.2)

## 2019-03-15 MED ORDER — ALUM & MAG HYDROXIDE-SIMETH 200-200-20 MG/5ML PO SUSP
30.0000 mL | Freq: Once | ORAL | Status: AC
  Administered 2019-03-15: 30 mL via ORAL
  Filled 2019-03-15: qty 30

## 2019-03-15 NOTE — ED Provider Notes (Signed)
[facility] EMERGENCY DEPARTMENT Provider Note   CSN: [id number] Arrival date & time: 03/15/19  1854     History Chief Complaint  Patient presents with  . Chest Pain  . Numbness    Jerry Butler is a 34 y.o. male w/ hx of HTN, obesity, reflux, presenting to the ED with chest pain.  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  Pressure sensation in the left side of his chest.  This was associated with some tingling in his left elbow and his left hand.  The chest pressure sensation has persisted but is milder, maybe 3/10 intensity, but the numbness has gone away.  He say the chest pressure feels like his reflux has in the past, but more intense.  He reports he frequently gets reflux after eating sauces and certain meals, especially at night in bed.  He has also had issues with tingling in his left elbow and his left hand, in the same pattern, in the past.  He says this tends to happen when he sleeps a certain way, with his arms bent under his head, for example.  He reports his chest pain is reproducible with deep breaths and with stretching his left arm overhead.  He says he has had similar chest pain in his bilateral pecs in the past when doing chest exercises (eg flies) specifically at the gym, but not with any other type of exertion or exercise.  He tells me he recently had a PCP appointment and was started on HCTZ, advised that he had HTN.  He denies hx of HLD or diabetes.  He denies significant family hx of heart disease.  He denies smoking history.  He denies hx of angina.  NKDA  HPI     Past Medical History:  Diagnosis Date  . Hypertension   . UTI (urinary tract infection)     There are no problems to display for this patient.   Past Surgical History:  Procedure Laterality Date  . CYSTOSCOPY         No family history on file.  Social History    Tobacco Use  . Smoking status: Never Smoker  Substance Use Topics  . Alcohol use: No  . Drug use: No    Home Medications Prior to Admission medications   Medication Sig Start Date End Date Taking? Authorizing Provider  methylPREDNISolone (MEDROL DOSEPAK) 4 MG TBPK tablet Take as directed 11/21/17   Lurline Idol, FNP    Allergies    Patient has no known allergies.  Review of Systems   Review of Systems  Constitutional: Negative for chills and fever.  Respiratory: Negative for cough and shortness of breath.   Cardiovascular: Positive for chest pain. Negative for palpitations.  Gastrointestinal: Negative for abdominal pain and vomiting.  Skin: Negative for color change and rash.  Neurological: Positive for numbness. Negative for syncope and weakness.  All other systems reviewed and are negative.   Physical Exam Updated Vital Signs BP (!) 154/100 (BP Location: Right Arm)   Pulse 79   Temp 99.3 F (37.4 C) (Oral)   Resp 18   SpO2 97%   Physical Exam Vitals and nursing note reviewed.  Constitutional:      Appearance: He is well-developed. He is obese.  HENT:     Head: Normocephalic and atraumatic.  Eyes:     Conjunctiva/sclera: Conjunctivae normal.  Cardiovascular:  Rate and Rhythm: Normal rate and regular rhythm.     Heart sounds: No murmur.  Pulmonary:     Effort: Pulmonary effort is normal. No respiratory distress.     Breath sounds: Normal breath sounds.  Chest:     Comments: Chest pain reproducible with left arm raise Musculoskeletal:     Cervical back: Neck supple.  Skin:    General: Skin is warm and dry.  Neurological:     General: No focal deficit present.     Mental Status: He is alert and oriented to person, place, and time.     ED Results / Procedures / Treatments   Labs (all labs ordered are listed, but only abnormal results are displayed) Labs Reviewed  BASIC METABOLIC PANEL - Abnormal; Notable for the following components:       Result Value   Potassium 3.4 (*)    All other components within normal limits  CBC - Abnormal; Notable for the following components:   WBC 10.7 (*)    Hemoglobin 12.9 (*)    All other components within normal limits  NOVEL CORONAVIRUS, NAA (HOSP ORDER, SEND-OUT TO REF LAB; TAT 18-24 HRS)  TROPONIN I (HIGH SENSITIVITY)  TROPONIN I (HIGH SENSITIVITY)    EKG EKG Interpretation  Date/Time:  Friday March 15 2019 18:59:07 EST Ventricular Rate:  92 PR Interval:  180 QRS Duration: 90 QT Interval:  356 QTC Calculation: 440 R Axis:   55 Text Interpretation: Normal sinus rhythm T wave abnormality, consider inferolateral ischemia Abnormal ECG No STEMI Confirmed by Octaviano Glow [phone]) on 03/15/2019 8:25:42 PM   Radiology DG Chest 2 View  Result Date: 03/15/2019 CLINICAL DATA:  Intermittent chest pain and left arm numbness today. EXAM: CHEST - 2 VIEW COMPARISON:  PA and lateral chest 04/24/2010. FINDINGS: Lungs clear. Heart size normal. No pneumothorax or pleural effusion. No acute or focal bony abnormality. IMPRESSION: Negative chest. Electronically Signed   By: Inge Rise M.D.   On: 03/15/2019 19:33    Procedures Procedures (including critical care time)  Medications Ordered in ED Medications  alum & mag hydroxide-simeth (MAALOX/MYLANTA) 200-200-20 MG/5ML suspension 30 mL (30 mLs Oral Given 03/15/19 2117)    ED Course  I have reviewed the triage vital signs and the nursing notes.  Pertinent labs & imaging results that were available during my care of the patient were reviewed by me and considered in my medical decision making (see chart for details).  34 yo male presenting with chest pain onset this  morning.  Also reporting left arm paresthesias.  I suspect this is largely musculoskeletal, as his pain is reproducible with arm movement on exam, and similar to discomfort he's had with chest (pec major) exercises at the gym.  I similarly suspect he may have some cervical  radiculopathy or peripheral nerve impingement in his left arm, as he has had this "numbness" pattern in the past when sleeping in certain positions in the bed.  I can't completely rule out the possibility of coronary cause for his symptoms, and I explained that to the patient.  He has a  HEART score of 3 (+1 for nonspecific ecg t wave inversions, +1 for history, +1 for two risk factors). This puts him in a low/moderate risk group.  His lack of sig family hx, or hx of angina, and the onset of symptoms at rest today, make the likelihood of ACS less so.  I felt it was reasonable for him to follow up with cardiology, but I  did stress the importance that he do so, given his body habitus and his HTN.  Based on this workup, I have a low clinical suspicion for PE, PNA, PTX, or aortic dissection  Ayyub Krall was evaluated in Emergency Department on 03/16/2019 for the symptoms described in the history of present illness. He was evaluated in the context of the global COVID-19 pandemic, which necessitated consideration that the patient might be at risk for infection with the SARS-CoV-2 virus that causes COVID-19. Institutional protocols and algorithms that pertain to the evaluation of patients at risk for COVID-19 are in a state of rapid change based on information released by regulatory bodies including the CDC and federal and state organizations. These policies and algorithms were followed during the patient's care in the ED.    Final Clinical Impression(s) / ED Diagnoses Final diagnoses:  Chest pain, unspecified type    Rx / DC Orders ED Discharge Orders    None       Carneshia Raker, Kermit Balo, MD 03/16/19 [phone]

## 2019-03-15 NOTE — ED Notes (Signed)
Discharge instructions reviewed with pt; pt given resources to follow up with cardiologist; verbalizes understanding. Pt stable and ready for discharge at this time.

## 2019-03-15 NOTE — Discharge Instructions (Addendum)
Your caregiver has diagnosed you as having chest pain that is not specific for one problem, but does not require admission.  As we discussed, you do have some risk factors for heart disease, but overall are considered low risk for an acute heart condition.  Therefore, I recommended that you follow-up with a cardiologist as an outpatient in the next 1-2 weeks.  SEEK IMMEDIATE MEDICAL ATTENTION IF: You have severe chest pain, especially if the pain is crushing or pressure-like and spreads to the arms, back, neck, or jaw, or if you have sweating, nausea (feeling sick to your stomach), or shortness of breath. THIS IS AN EMERGENCY. Don't wait to see if the pain will go away. Get medical help at once. Call 911 or 0 (operator). DO NOT drive yourself to the hospital.   Your chest pain gets worse and does not go away with rest.  You have an attack of chest pain lasting longer than usual, despite rest and treatment with the medications your caregiver has prescribed.  You wake from sleep with chest pain or shortness of breath.  You feel dizzy or faint.  You have chest pain not typical of your usual pain for which you originally saw your caregiver.

## 2019-03-15 NOTE — ED Triage Notes (Signed)
Pt reports intermittent chest pain and left arm numbness today. Denies any pain or numbness at this time. Pt a.o, nad noted at this time.

## 2019-03-17 ENCOUNTER — Encounter (HOSPITAL_COMMUNITY): Payer: Self-pay

## 2019-03-17 ENCOUNTER — Other Ambulatory Visit: Payer: Self-pay

## 2019-03-17 ENCOUNTER — Emergency Department (HOSPITAL_COMMUNITY)
Admission: EM | Admit: 2019-03-17 | Discharge: 2019-03-17 | Disposition: A | Discharge status: Home / Self Care | Payer: PRIVATE HEALTH INSURANCE | Attending: Emergency Medicine | Admitting: Emergency Medicine

## 2019-03-17 DIAGNOSIS — S3991XA Unspecified injury of abdomen, initial encounter: Secondary | ICD-10-CM | POA: Diagnosis present

## 2019-03-17 DIAGNOSIS — Y9389 Activity, other specified: Secondary | ICD-10-CM | POA: Diagnosis not present

## 2019-03-17 DIAGNOSIS — Y92538 Other ambulatory health services establishments as the place of occurrence of the external cause: Secondary | ICD-10-CM | POA: Diagnosis not present

## 2019-03-17 DIAGNOSIS — Y990 Civilian activity done for income or pay: Secondary | ICD-10-CM | POA: Diagnosis not present

## 2019-03-17 DIAGNOSIS — Z79899 Other long term (current) drug therapy: Secondary | ICD-10-CM | POA: Insufficient documentation

## 2019-03-17 DIAGNOSIS — I10 Essential (primary) hypertension: Secondary | ICD-10-CM | POA: Diagnosis not present

## 2019-03-17 DIAGNOSIS — W503XXA Accidental bite by another person, initial encounter: Secondary | ICD-10-CM | POA: Insufficient documentation

## 2019-03-17 DIAGNOSIS — S31159A Open bite of abdominal wall, unspecified quadrant without penetration into peritoneal cavity, initial encounter: Secondary | ICD-10-CM | POA: Diagnosis not present

## 2019-03-17 DIAGNOSIS — Z23 Encounter for immunization: Secondary | ICD-10-CM | POA: Diagnosis not present

## 2019-03-17 MED ORDER — TETANUS-DIPHTH-ACELL PERTUSSIS 5-2.5-18.5 LF-MCG/0.5 IM SUSP
0.5000 mL | Freq: Once | INTRAMUSCULAR | Status: AC
  Administered 2019-03-17: 13:00:00 0.5 mL via INTRAMUSCULAR
  Filled 2019-03-17: qty 0.5

## 2019-03-17 MED ORDER — AMOXICILLIN-POT CLAVULANATE 875-125 MG PO TABS: 1.0000 | ORAL_TABLET | Freq: Two times a day (BID) | ORAL | 0 refills | Status: DC

## 2019-03-17 NOTE — ED Notes (Signed)
Wound was irrigated with sterile saline, dried and wrapped with gauze and tape.
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
## 2019-03-17 NOTE — Discharge Instructions (Signed)
  Wound Care - General Wound Cleaning: Clean the wound and surrounding area gently with tap water and mild soap. Rinse well and blot dry.   Clean the wound daily to prevent infection.  Do not use cleaners such as hydrogen peroxide or alcohol.   Scar reduction: Application of a topical antibiotic ointment, such as Neosporin, after the wound has begun to close and heal well can decrease scab formation and reduce scarring. After the wound has healed, application of ointments such as Aquaphor can also reduce scar formation.  The key to scar reduction is keeping the skin well hydrated and supple. Drinking plenty of water throughout the day (At least eight 8oz glasses of water a day) is essential to staying well hydrated.  Sun exposure: Keep the wound out of the sun. After the wound has healed, continue to protect it from the sun by wearing protective clothing or applying sunscreen.  Pain: You may use Tylenol, naproxen, or ibuprofen for pain. Antiinflammatory medications: Take 600 mg of ibuprofen every 6 hours or 440 mg (over the counter dose) to 500 mg (prescription dose) of naproxen every 12 hours for the next 3 days. After this time, these medications may be used as needed for pain. Take these medications with food to avoid upset stomach. Choose only one of these medications, do not take them together. Acetaminophen (generic for Tylenol): Should you continue to have additional pain while taking the ibuprofen or naproxen, you may add in acetaminophen as needed. Your daily total maximum amount of acetaminophen from all sources should be limited to 4000mg /day for persons without liver problems, or 2000mg /day for those with liver problems.  Return: Return to the ED should signs of infection arise, such as spreading redness, puffiness/swelling, pus draining from the wound, severe increase in pain, fever over 100.63F, or any other major issues.  For prescription assistance, may try using prescription discount  sites or apps, such as goodrx.com
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
## 2019-03-18 LAB — NOVEL CORONAVIRUS, NAA (HOSP ORDER, SEND-OUT TO REF LAB; TAT 18-24 HRS): SARS-CoV-2, NAA: NOT DETECTED

## 2019-03-27 ENCOUNTER — Other Ambulatory Visit (HOSPITAL_COMMUNITY): Payer: Self-pay | Admitting: Family Medicine

## 2019-03-27 DIAGNOSIS — I10 Essential (primary) hypertension: Secondary | ICD-10-CM | POA: Diagnosis not present

## 2019-04-29 MED FILL — AMLODIPINE-OLMESARTAN 10-20: 10-20 | 90 days supply | Qty: 90 | Fill #0

## 2019-04-29 MED FILL — HYDROCHLOROTHIAZIDE 25 MG T: 25 | 60 days supply | Qty: 60 | Fill #0

## 2019-04-30 DIAGNOSIS — I10 Essential (primary) hypertension: Secondary | ICD-10-CM | POA: Diagnosis not present

## 2019-07-10 ENCOUNTER — Emergency Department (HOSPITAL_COMMUNITY): Payer: 59

## 2019-07-10 ENCOUNTER — Emergency Department (HOSPITAL_COMMUNITY)
Admission: EM | Admit: 2019-07-10 | Discharge: 2019-07-10 | Disposition: A | Discharge status: Home / Self Care | Payer: 59 | Attending: Emergency Medicine | Admitting: Emergency Medicine

## 2019-07-10 ENCOUNTER — Other Ambulatory Visit: Payer: Self-pay

## 2019-07-10 ENCOUNTER — Encounter (HOSPITAL_COMMUNITY): Payer: Self-pay | Admitting: Emergency Medicine

## 2019-07-10 DIAGNOSIS — R002 Palpitations: Secondary | ICD-10-CM | POA: Insufficient documentation

## 2019-07-10 DIAGNOSIS — I10 Essential (primary) hypertension: Secondary | ICD-10-CM | POA: Insufficient documentation

## 2019-07-10 DIAGNOSIS — E876 Hypokalemia: Secondary | ICD-10-CM | POA: Diagnosis not present

## 2019-07-10 DIAGNOSIS — R52 Pain, unspecified: Secondary | ICD-10-CM | POA: Diagnosis not present

## 2019-07-10 DIAGNOSIS — J984 Other disorders of lung: Secondary | ICD-10-CM | POA: Diagnosis not present

## 2019-07-10 DIAGNOSIS — R079 Chest pain, unspecified: Secondary | ICD-10-CM | POA: Diagnosis not present

## 2019-07-10 DIAGNOSIS — Z79899 Other long term (current) drug therapy: Secondary | ICD-10-CM | POA: Insufficient documentation

## 2019-07-10 DIAGNOSIS — R0789 Other chest pain: Secondary | ICD-10-CM | POA: Insufficient documentation

## 2019-07-10 LAB — TROPONIN I (HIGH SENSITIVITY)
Troponin I (High Sensitivity): <2 ng/L (ref <18)
Troponin I (High Sensitivity): 3 ng/L (ref <18)

## 2019-07-10 LAB — CBC
HCT: 36.5 % — ABNORMAL LOW (ref 39.0–52.0)
Hemoglobin: 11.8 g/dL — ABNORMAL LOW (ref 13.0–17.0)
MCH: 29.9 pg (ref 26.0–34.0)
MCHC: 32.3 g/dL (ref 30.0–36.0)
MCV: 92.4 fL (ref 80.0–100.0)
Platelets: 349 10*3/uL (ref 150–400)
RBC: 3.95 MIL/uL — ABNORMAL LOW (ref 4.22–5.81)
RDW: 13.7 % (ref 11.5–15.5)
WBC: 9.3 10*3/uL (ref 4.0–10.5)
nRBC: 0 % (ref 0.0–0.2)

## 2019-07-10 LAB — BASIC METABOLIC PANEL
Anion gap: 10 (ref 5–15)
BUN: 14 mg/dL (ref 6–20)
CO2: 26 mmol/L (ref 22–32)
Calcium: 9.5 mg/dL (ref 8.9–10.3)
Chloride: 103 mmol/L (ref 98–111)
Creatinine, Ser: 1.04 mg/dL (ref 0.61–1.24)
GFR calc Af Amer: >60 mL/min (ref >60)
GFR calc non Af Amer: >60 mL/min (ref >60)
Glucose, Bld: 105 mg/dL — ABNORMAL HIGH (ref 70–99)
Potassium: 3.3 mmol/L — ABNORMAL LOW (ref 3.5–5.1)
Sodium: 139 mmol/L (ref 135–145)

## 2019-07-10 LAB — PROTIME-INR
INR: 1.1 (ref 0.8–1.2)
Prothrombin Time: 13.7 seconds (ref 11.4–15.2)

## 2019-07-10 MED ORDER — SODIUM CHLORIDE 0.9% FLUSH: 3.0000 mL | Freq: Once | INTRAVENOUS | Status: DC

## 2019-07-10 MED ORDER — ACETAMINOPHEN 500 MG PO TABS: 500.0000 mg | ORAL_TABLET | Freq: Four times a day (QID) | ORAL | 0 refills | Status: AC | PRN

## 2019-07-10 MED ORDER — POTASSIUM CHLORIDE CRYS ER 20 MEQ PO TBCR
40.0000 meq | EXTENDED_RELEASE_TABLET | Freq: Once | ORAL | Status: AC
  Administered 2019-07-10: 40 meq via ORAL
  Filled 2019-07-10: qty 2

## 2019-07-10 MED ORDER — IBUPROFEN 600 MG PO TABS: 600.0000 mg | ORAL_TABLET | Freq: Four times a day (QID) | ORAL | 0 refills | Status: AC | PRN

## 2019-07-10 NOTE — ED Provider Notes (Signed)
[facility] EMERGENCY DEPARTMENT Provider Note   CSN: [id number] Arrival date & time: 07/10/19  0050     History Chief Complaint  Patient presents with  . Chest Pain    Jerry Butler is a 34 y.o. male with history of hypertension presents for evaluation of acute onset, improved chest pains.  He reports that around 11:30 PM last night while sitting at his computer he developed sudden onset feeling of palpitations, left sided chest tightness radiating up to the neck and left side of the face and left upper extremity.  He reports feeling chills and as though his body were cold and mild lightheadedness but denies syncope, diaphoresis, shortness of breath, nausea, vomiting.  He states that symptoms lasted for around 10 minutes and then resolved while sitting at rest while waiting for EMS to arrive.  When the symptoms resolved he had some residual left-sided chest soreness that he describes as "like a pulled muscle".  He reports that he can exacerbate the left chest soreness by certain movements and rotation of the neck.  Upon EMS arrival he received 324 mg of aspirin and 2 sublingual nitroglycerin with further improvement in his symptoms.  He reports very mild left-sided chest soreness at this time.  He is a non-smoker, denies recreational drug use, no family history of heart disease under the age of 73.  He was seen in February for somewhat similar symptoms and had a negative chest pain work-up and was recommended to follow-up with cardiology but has not done so yet.  He has mild bilateral lower extremity edema which he reports is not unusual for him.  The history is provided by the patient.       Past Medical History:  Diagnosis Date  . Hypertension   . UTI (urinary tract infection)     There are no problems to display for this patient.   Past Surgical History:  Procedure Laterality Date  . CYSTOSCOPY         No family history on file.  Social History    Tobacco Use  . Smoking status: Never Smoker  . Smokeless tobacco: Never Used  Substance Use Topics  . Alcohol use: No  . Drug use: No    Home Medications Prior to Admission medications   Medication Sig Start Date End Date Taking? Authorizing Provider  acetaminophen (TYLENOL) 500 MG tablet Take 1 tablet (500 mg total) by mouth every 6 (six) hours as needed. 07/10/19   Evon Lopezperez A, PA-C  amLODipine (NORVASC) 5 MG tablet Take 5 mg by mouth daily. 02/19/19   [provider]  amoxicillin-clavulanate (AUGMENTIN) 875-125 MG tablet Take 1 tablet by mouth every 12 (twelve) hours. 03/17/19   Joy, Shawn C, PA-C  hydrochlorothiazide (HYDRODIURIL) 25 MG tablet Take 25 mg by mouth daily. 02/21/19   [provider]  ibuprofen (ADVIL) 600 MG tablet Take 1 tablet (600 mg total) by mouth every 6 (six) hours as needed. 07/10/19   Najma Bozarth A, PA-C  methylPREDNISolone (MEDROL DOSEPAK) 4 MG TBPK tablet Take as directed Patient not taking: Reported on 03/17/2019 11/21/17   Lurline Idol, FNP    Allergies    Patient has no known allergies.  Review of Systems   Review of Systems  Constitutional: Positive for chills (resolved). Negative for diaphoresis and fever.  Respiratory: Negative for shortness of breath.   Cardiovascular: Positive for chest pain, palpitations (resolved) and leg swelling.  Gastrointestinal: Negative for abdominal pain, nausea and vomiting.  Neurological: Positive  for light-headedness (resolved).  All other systems reviewed and are negative.   Physical Exam Updated Vital Signs BP 131/78 (BP Location: Left Arm)   Pulse 77   Temp 97.6 F (36.4 C) (Oral)   Resp 17   Ht 6' (1.829 m)   Wt (!) 158.8 kg   SpO2 98%   BMI 47.47 kg/m   Physical Exam Vitals and nursing note reviewed.  Constitutional:      General: He is not in acute distress.    Appearance: He is well-developed. He is obese.     Comments: Resting in bed  HENT:     Head: Normocephalic and  atraumatic.  Eyes:     General:        Right eye: No discharge.        Left eye: No discharge.     Conjunctiva/sclera: Conjunctivae normal.  Neck:     Vascular: No JVD.     Trachea: No tracheal deviation.  Cardiovascular:     Rate and Rhythm: Normal rate and regular rhythm.     Pulses:          Radial pulses are 2+ on the right side and 2+ on the left side.       Dorsalis pedis pulses are 2+ on the right side and 2+ on the left side.       Posterior tibial pulses are 2+ on the right side and 2+ on the left side.     Heart sounds: Normal heart sounds.     Comments: Homans sign absent bilaterally, trace pitting edema of the bilateral lower extremities, no palpable cords, compartments are soft  Pulmonary:     Effort: Pulmonary effort is normal.     Breath sounds: Normal breath sounds.  Chest:     Chest wall: Tenderness present.     Comments: Left anterior chest wall tenderness around the pectoralis muscle.  No deformity, crepitus, ecchymosis or flail segment. Abdominal:     General: There is no distension.     Palpations: Abdomen is soft.     Tenderness: There is no abdominal tenderness. There is no guarding or rebound.  Musculoskeletal:     Right lower leg: No tenderness. Edema present.     Left lower leg: No tenderness. Edema present.  Skin:    General: Skin is warm.     Findings: No erythema.  Neurological:     Mental Status: He is alert.  Psychiatric:        Behavior: Behavior normal.     ED Results / Procedures / Treatments   Labs (all labs ordered are listed, but only abnormal results are displayed) Labs Reviewed  BASIC METABOLIC PANEL - Abnormal; Notable for the following components:      Result Value   Potassium 3.3 (*)    Glucose, Bld 105 (*)    All other components within normal limits  CBC - Abnormal; Notable for the following components:   RBC 3.95 (*)    Hemoglobin 11.8 (*)    HCT 36.5 (*)    All other components within normal limits  PROTIME-INR    TROPONIN I (HIGH SENSITIVITY)  TROPONIN I (HIGH SENSITIVITY)    EKG EKG Interpretation  Date/Time:  Wednesday July 10 2019 06:54:27 EDT Ventricular Rate:  65 PR Interval:  188 QRS Duration: 105 QT Interval:  401 QTC Calculation: 417 R Axis:   60 Text Interpretation: Sinus rhythm Borderline repolarization abnormality ST elevation, consider lateral injury When compared with ECG of  EARLIER SAME DATE No significant change was found Confirmed by Dione Booze ([id number]) on 07/10/2019 6:58:10 AM   Radiology DG Chest 2 View  Result Date: 07/10/2019 CLINICAL DATA:  Chest pain EXAM: CHEST - 2 VIEW COMPARISON:  03/15/2019 FINDINGS: Heart and mediastinal contours are within normal limits. No focal opacities or effusions. No acute bony abnormality. IMPRESSION: No active cardiopulmonary disease. Electronically Signed   By: Charlett Nose M.D.   On: 07/10/2019 01:17    Procedures Procedures (including critical care time)  Medications Ordered in ED Medications  sodium chloride flush (NS) 0.9 % injection 3 mL (3 mLs Intravenous Not Given 07/10/19 0724)  potassium chloride SA (KLOR-CON) CR tablet 40 mEq (40 mEq Oral Given 07/10/19 0724)    ED Course  I have reviewed the triage vital signs and the nursing notes.  Pertinent labs & imaging results that were available during my care of the patient were reviewed by me and considered in my medical decision making (see chart for details).    MDM Rules/Calculators/A&P                          Patient presenting for evaluation of sudden onset left-sided chest tightness, palpitations with radiation to the left upper extremity and left side of the neck.  Symptoms were short-lived but after resolving within about 10 minutes of onset he had residual left-sided chest "soreness".  This is reproducible on palpation of the chest wall suggesting possible musculoskeletal etiology.  He is afebrile and vital signs are stable.  He reports that he feels much better than  when his symptoms first began and states "I think I might have had a panic attack but I am not sure".  His EKG shows T wave inversions in the inferior lateral leads but repeat EKG shows resolution of inversions in leads V4, V5, and V6.  His EKG is similar to prior in February.  His chest x-ray shows no acute cardiopulmonary abnormalities.  Lab work reviewed and interpreted by myself shows no leukocytosis, stable anemia, no metabolic derangements or renal insufficiency.  He is mildly hypokalemic, replenished orally in the ED.  Serial troponins are negative.  At this time no evidence of ACS/MI, PE, cardiac tamponade, esophageal rupture, pneumonia or pneumothorax.  He does have a HEART score of 4 due to his history, EKG abnormality and risk factors (obesity and hypertension).  He was recommended to follow-up with cardiology on an outpatient basis but had forgotten to do so.  I have reiterated the importance of follow-up with cardiology.  I will place a referral but he understands to call to schedule an appointment as well.  Discussed that there may be some musculoskeletal etiology of his symptoms and he will try a course of NSAIDs at home.  With regards to his lower extremity edema he reports that this is chronic and unchanged.  Recommended elevation and compression stockings.  Discussed strict ED return precautions.  Patient verbalized understanding of and agreement with plan and is stable for discharge at this time.  Discussed with Dr. Deretha Emory who agrees with assessment and plan at this time  Final Clinical Impression(s) / ED Diagnoses Final diagnoses:  Atypical chest pain  Chest wall pain  Hypokalemia    Rx / DC Orders ED Discharge Orders         Ordered    Ambulatory referral to Cardiology     Discontinue  Reprint     07/10/19 0716  ibuprofen (ADVIL) 600 MG tablet  Every 6 hours PRN     Discontinue  Reprint     07/10/19 0754    acetaminophen (TYLENOL) 500 MG tablet  Every 6 hours PRN      Discontinue  Reprint     07/10/19 0754           Jeanie Sewer, PA-C 07/10/19 0818    Vanetta Mulders, MD 07/11/19 [phone]

## 2019-07-10 NOTE — Discharge Instructions (Addendum)
You can take 1 to 2 tablets of Tylenol (350mg -1000mg  depending on the dose) every 6 hours as needed for pain.  Do not exceed 4000 mg of Tylenol daily.  If your pain persists you can take a doses of ibuprofen in between doses of Tylenol.  I usually recommend 400 to 600 mg of ibuprofen every 6 hours.  Take this with food to avoid upset stomach issues.  Your work-up today was reassuring with no evidence of heart attack or other concerning abnormalities although your potassium was a little bit low.  We replenished this with potassium tablets in the emergency department.  Your EKG was also a little bit abnormal but very similar to the EKG performed in February.   For your lower extremity swelling I would recommend elevating your legs and wearing compression stockings as needed.  It will be very important to follow-up with a cardiologist on an outpatient basis.  I have placed a referral so they will likely call you but if you do not hear back from them today I would recommend calling to schedule an appointment.  Return to the emergency department if any concerning signs or symptoms develop such as severe pains, lightheadedness, loss of consciousness, shortness of breath, nausea vomiting or breaking out to a sweat.

## 2019-07-10 NOTE — ED Triage Notes (Signed)
Patient arrived with EMS from home reports left chest tightness this evening radiating to left neck/left arm and left shoulder , no SOB , denies emesis or diaphoresis . He received ASA 324 mg and 2 NTG sl with relief . No cough or fever.

## 2019-07-14 ENCOUNTER — Emergency Department (HOSPITAL_COMMUNITY)
Admission: EM | Admit: 2019-07-14 | Discharge: 2019-07-14 | Disposition: A | Discharge status: Home / Self Care | Payer: 59 | Attending: Emergency Medicine | Admitting: Emergency Medicine

## 2019-07-14 ENCOUNTER — Other Ambulatory Visit: Payer: Self-pay

## 2019-07-14 ENCOUNTER — Encounter (HOSPITAL_COMMUNITY): Payer: Self-pay | Admitting: Emergency Medicine

## 2019-07-14 ENCOUNTER — Emergency Department (HOSPITAL_COMMUNITY): Payer: 59

## 2019-07-14 DIAGNOSIS — W5501XA Bitten by cat, initial encounter: Secondary | ICD-10-CM | POA: Diagnosis not present

## 2019-07-14 DIAGNOSIS — Z79899 Other long term (current) drug therapy: Secondary | ICD-10-CM | POA: Diagnosis not present

## 2019-07-14 DIAGNOSIS — Y939 Activity, unspecified: Secondary | ICD-10-CM | POA: Diagnosis not present

## 2019-07-14 DIAGNOSIS — S6991XA Unspecified injury of right wrist, hand and finger(s), initial encounter: Secondary | ICD-10-CM | POA: Diagnosis present

## 2019-07-14 DIAGNOSIS — Z880 Allergy status to penicillin: Secondary | ICD-10-CM | POA: Diagnosis not present

## 2019-07-14 DIAGNOSIS — Y999 Unspecified external cause status: Secondary | ICD-10-CM | POA: Insufficient documentation

## 2019-07-14 DIAGNOSIS — S61451A Open bite of right hand, initial encounter: Secondary | ICD-10-CM | POA: Diagnosis not present

## 2019-07-14 DIAGNOSIS — Y929 Unspecified place or not applicable: Secondary | ICD-10-CM | POA: Diagnosis not present

## 2019-07-14 DIAGNOSIS — I10 Essential (primary) hypertension: Secondary | ICD-10-CM | POA: Diagnosis not present

## 2019-07-14 DIAGNOSIS — S61250A Open bite of right index finger without damage to nail, initial encounter: Secondary | ICD-10-CM | POA: Diagnosis not present

## 2019-07-14 MED ORDER — BACITRACIN ZINC 500 UNIT/GM EX OINT
TOPICAL_OINTMENT | Freq: Once | CUTANEOUS | Status: AC
  Administered 2019-07-14: 1 via TOPICAL
  Filled 2019-07-14: qty 0.9

## 2019-07-14 MED ORDER — DOXYCYCLINE HYCLATE 100 MG PO CAPS: 100.0000 mg | ORAL_CAPSULE | Freq: Two times a day (BID) | ORAL | 0 refills | Status: AC

## 2019-07-14 MED ORDER — DOXYCYCLINE HYCLATE 100 MG PO TABS
100.0000 mg | ORAL_TABLET | Freq: Once | ORAL | Status: AC
  Administered 2019-07-14: 100 mg via ORAL
  Filled 2019-07-14: qty 1

## 2019-07-14 NOTE — ED Provider Notes (Signed)
[facility]-EMERGENCY DEPT Provider Note   CSN: [id number] Arrival date & time: 07/14/19  1820     History Chief Complaint  Patient presents with  . Animal Bite    Jerry Butler is a 34 y.o. male past medical history of hypertension, presenting to the emergency department with complaint of cat bite to his right index finger that occurred yesterday evening.  This is his personal pet, not yet vaccinated against rabies, however is not showing any rapid signs.  He states he cleaned his wound yesterday however this morning woke up and it appeared more swollen.  He denies any purulent drainage, fevers, history immunocompromise.  He states he had adverse reaction to amoxicillin somewhat recently.  Tetanus was updated 1 year ago.  The history is provided by the patient.       Past Medical History:  Diagnosis Date  . Hypertension   . UTI (urinary tract infection)     There are no problems to display for this patient.   Past Surgical History:  Procedure Laterality Date  . CYSTOSCOPY         No family history on file.  Social History   Tobacco Use  . Smoking status: Never Smoker  . Smokeless tobacco: Never Used  Substance Use Topics  . Alcohol use: No  . Drug use: No    Home Medications Prior to Admission medications   Medication Sig Start Date End Date Taking? Authorizing Provider  acetaminophen (TYLENOL) 500 MG tablet Take 1 tablet (500 mg total) by mouth every 6 (six) hours as needed. 07/10/19   Fawze, Mina A, PA-C  amLODipine (NORVASC) 5 MG tablet Take 5 mg by mouth daily. 02/19/19   [provider]  amoxicillin-clavulanate (AUGMENTIN) 875-125 MG tablet Take 1 tablet by mouth every 12 (twelve) hours. 03/17/19   Joy, Shawn C, PA-C  doxycycline (VIBRAMYCIN) 100 MG capsule Take 1 capsule (100 mg total) by mouth 2 (two) times daily for 7 days. 07/14/19 07/21/19  Rosalie Gelpi, [place] N, PA-C  hydrochlorothiazide (HYDRODIURIL) 25 MG tablet Take 25 mg by  mouth daily. 02/21/19   [provider]  ibuprofen (ADVIL) 600 MG tablet Take 1 tablet (600 mg total) by mouth every 6 (six) hours as needed. 07/10/19   Fawze, Mina A, PA-C  methylPREDNISolone (MEDROL DOSEPAK) 4 MG TBPK tablet Take as directed Patient not taking: Reported on 03/17/2019 11/21/17   Lurline Idol, FNP    Allergies    Amoxicillin  Review of Systems   Review of Systems  Constitutional: Negative for fever.  Skin: Positive for wound.    Physical Exam Updated Vital Signs BP (!) 150/97   Pulse 81   Temp 99 F (37.2 C)   Resp 16   SpO2 96%   Physical Exam Vitals and nursing note reviewed.  Constitutional:      General: He is not in acute distress.    Appearance: He is well-developed.  HENT:     Head: Normocephalic and atraumatic.  Eyes:     Conjunctiva/sclera: Conjunctivae normal.  Cardiovascular:     Rate and Rhythm: Normal rate.  Pulmonary:     Effort: Pulmonary effort is normal.  Musculoskeletal:     Comments: Right index finger with 2 small superficial puncture wounds, 1 to the radial aspect of the middle phalanx as well as the palmar aspect of the proximal phalanx.  Not draining.  No obvious foreign body.  Superficial lacerations to the base of the right index finger along the radial  aspect.  The digit appears swollen with mild erythema present.  Normal range of motion.  Neurological:     Mental Status: He is alert.  Psychiatric:        Mood and Affect: Mood normal.        Behavior: Behavior normal.     ED Results / Procedures / Treatments   Labs (all labs ordered are listed, but only abnormal results are displayed) Labs Reviewed - No data to display  EKG None  Radiology DG Finger Index Right  Result Date: 07/14/2019 CLINICAL DATA:  Cat bite.  Increasing pain today. EXAM: RIGHT INDEX FINGER 2+V COMPARISON:  None. FINDINGS: There is no evidence of fracture or dislocation. There is no evidence of arthropathy or other focal bone  abnormality. Mild soft tissue edema without soft tissue air or radiopaque foreign body. IMPRESSION: Mild soft tissue edema. Electronically Signed   By: Keith Rake M.D.   On: 07/14/2019 20:49    Procedures Procedures (including critical care time)  Medications Ordered in ED Medications  doxycycline (VIBRA-TABS) tablet 100 mg (has no administration in time range)  bacitracin ointment (has no administration in time range)    ED Course  I have reviewed the triage vital signs and the nursing notes.  Pertinent labs & imaging results that were available during my care of the patient were reviewed by me and considered in my medical decision making (see chart for details).    MDM Rules/Calculators/A&P                           Patient with cat bite to his right index finger that occurred last night by his own pet cat.  Not currently up-to-date on rabies shots however is not showing any rib rabid signs and patient prefers to hold on rabies prophylaxis at this time.  The digit is swollen with some mild generalized erythema though no drainage from the wounds.  Normal range of motion of the finger, low suspicion for a flexor tenosynovitis.  No fluctuance to suggest abscess.  He has no systemic symptoms and is well-appearing.  X-rays negative for foreign body.  Will treat with doxycycline, encouraged soapy warm water soaks daily, and close recheck in 2 days.  Discussed risk of infection and importance of follow-up.  Return precautions discussed.  Patient is agreeable to plan, safe for discharge.   Final Clinical Impression(s) / ED Diagnoses Final diagnoses:  Cat bite of right hand, initial encounter    Rx / DC Orders ED Discharge Orders         Ordered    doxycycline (VIBRAMYCIN) 100 MG capsule  2 times daily     Discontinue  Reprint     07/14/19 2108           Shann Merrick, [place] N, PA-C 07/14/19 2109    Quintella Reichert, MD 07/15/19 0025

## 2019-07-14 NOTE — ED Triage Notes (Signed)
Patient c/o cat bit to right index finger last night. Personal vet, unvaccinated.

## 2019-07-14 NOTE — Discharge Instructions (Signed)
Keep your wounds clean and covered. Gently wash your wounds with soap and water daily. Take the antibiotic, doxycycline, as prescribed until completely gone. Protect your skin from the sun while taking this medication as it can make your skin more sensitive to the sun's rays. You can take ibuprofen every 6 hours as needed for pain and swelling. Have your wounds recheck in 2 days. Return to the emergency department for signs of infection which include fever, increasing redness surrounding your wound, or pus draining from your wound.

## 2019-07-15 MED FILL — DOXYCYCLINE HYCLATE 100 MG: 100 | 7 days supply | Qty: 14 | Fill #0

## 2019-07-16 NOTE — Progress Notes (Signed)
Cardiology Office Note:   Date:  07/17/2019  NAME:  Jerry Butler    MRN: [id number] DOB:  February 14, 1985   PCP:  Jerry Marvel, PA-C  Cardiologist:  No primary care provider on file.   Referring MD: Jerry Papa, PA-C   Chief Complaint  Patient presents with  . Chest Pain   History of Present Illness:   Jerry Butler is a 34 y.o. male with a hx of HTN who is being seen today for the evaluation of chest pain at the request of Jerry Butler, [place]. Evaluated in the ER several times for CP. Troponin negative. Follow-up with [place].  He reports for the last 1 year he had intermittent episodes of sharp chest pain.  He reports he gets pain in the left side of his chest at any time.  No identifiable trigger.  He reports the pain can last seconds to minutes and resolves without intervention.  He also reports an associated back pain.  He reports that activity such as walking does not worsen the pain.  He actually reports that when he exercises the pain is better.  He reports has been going to the gym 2 days/week and exercising up to 1 hour per session.  This includes walking on the treadmill and using a exercise bike.  She reports that his pain gets better when he exercises.  He does have acid reflux.  He is not on any medication at this time.  His EKG today demonstrates normal sinus rhythm with no acute ST-T changes.  I did review his blood work and he does have abnormal thyroid studies.  His TSH is low 0.30 but his T4 and T3 are normal.  Apparently his primary care physicians are monitoring this.  He has had issues with hypokalemia.  He is on thiazide diuretic and this could be stopped.  I will leave this to the discretion of his primary care physician.  He also reports that he does snore and has excessive fatigue during the day.  He does work in [place] here at [employer].  He does snore and I suspect he possibly has sleep apnea.  His BMI is 48 and he is definitely at risk for this.  This could also  explain his uncontrolled blood pressure.  He also reports that his chest pain episodes have coincided with poorly controlled blood pressure.  He will follow up with primary care physician about further management of this.  Past Medical History: Past Medical History:  Diagnosis Date  . Hypertension   . UTI (urinary tract infection)     Past Surgical History: Past Surgical History:  Procedure Laterality Date  . CYSTOSCOPY      Current Medications: Current Meds  Medication Sig  . acetaminophen (TYLENOL) 500 MG tablet Take 1 tablet (500 mg total) by mouth every 6 (six) hours as needed.  Marland Kitchen amLODipine (NORVASC) 5 MG tablet Take 5 mg by mouth daily.  Marland Kitchen doxycycline (VIBRAMYCIN) 100 MG capsule Take 1 capsule (100 mg total) by mouth 2 (two) times daily for 7 days.  . hydrochlorothiazide (HYDRODIURIL) 25 MG tablet Take 25 mg by mouth daily.  Marland Kitchen ibuprofen (ADVIL) 600 MG tablet Take 1 tablet (600 mg total) by mouth every 6 (six) hours as needed.  . [DISCONTINUED] amoxicillin-clavulanate (AUGMENTIN) 875-125 MG tablet Take 1 tablet by mouth every 12 (twelve) hours.  . [DISCONTINUED] methylPREDNISolone (MEDROL DOSEPAK) 4 MG TBPK tablet Take as directed     Allergies:    Amoxicillin  Social History: Social History   Socioeconomic History  . Marital status: Single    Spouse name: Not on file  . Number of children: Not on file  . Years of education: Not on file  . Highest education level: Not on file  Occupational History  . Occupation: security  Tobacco Use  . Smoking status: Never Smoker  . Smokeless tobacco: Never Used  Substance and Sexual Activity  . Alcohol use: No  . Drug use: No  . Sexual activity: Not on file  Other Topics Concern  . Not on file  Social History Narrative  . Not on file   Social Determinants of Health   Financial Resource Strain:   . Difficulty of Paying Living Expenses:   Food Insecurity:   . Worried About Programme researcher, broadcasting/film/video in the Last Year:   . Occupational psychologist in the Last Year:   Transportation Needs:   . Freight forwarder (Medical):   Marland Kitchen Lack of Transportation (Non-Medical):   Physical Activity:   . Days of Exercise per Week:   . Minutes of Exercise per Session:   Stress:   . Feeling of Stress :   Social Connections:   . Frequency of Communication with Friends and Family:   . Frequency of Social Gatherings with Friends and Family:   . Attends Religious Services:   . Active Member of Clubs or Organizations:   . Attends Banker Meetings:   Marland Kitchen Marital Status:      Family History: The patient's family history includes Hypertension in his mother.  ROS:   All other ROS reviewed and negative. Pertinent positives noted in the HPI.     EKGs/Labs/Other Studies Reviewed:   The following studies were personally reviewed by me today:  EKG:  EKG is ordered today.  The ekg ordered today demonstrates normal sinus rhythm, heart rate 78, no acute ST-T changes, no evidence for infarction, and was personally reviewed by me.   Recent Labs: 07/10/2019: BUN 14; Creatinine, Ser 1.04; Hemoglobin 11.8; Platelets 349; Potassium 3.3; Sodium 139   Recent Lipid Panel No results found for: CHOL, TRIG, HDL, CHOLHDL, VLDL, LDLCALC, LDLDIRECT  Physical Exam:   VS:  BP 130/90 (BP Location: Left Arm, Patient Position: Sitting, Cuff Size: Large)   Pulse 78   Temp (!) 97.5 F (36.4 C)   Ht 6' (1.829 m)   Wt (!) 358 lb (162.4 kg)   SpO2 95%   BMI 48.55 kg/m    Wt Readings from Last 3 Encounters:  07/17/19 (!) 358 lb (162.4 kg)  07/14/19 (!) 360 lb (163.3 kg)  07/10/19 (!) 350 lb (158.8 kg)    General: Well nourished, well developed, in no acute distress Heart: Atraumatic, normal size  Eyes: PEERLA, EOMI  Neck: Supple, no JVD Endocrine: No thryomegaly Cardiac: Normal S1, S2; RRR; no murmurs, rubs, or gallops Lungs: Clear to auscultation bilaterally, no wheezing, rhonchi or rales  Abd: Soft, nontender, no hepatomegaly  Ext: No  edema, pulses 2+ Musculoskeletal: No deformities, BUE and BLE strength normal and equal Skin: Warm and dry, no rashes   Neuro: Alert and oriented to person, place, time, and situation, CNII-XII grossly intact, no focal deficits  Psych: Normal mood and affect   ASSESSMENT:   Sharron Simpson is a 34 y.o. male who presents for the following: 1. Chest pain, unspecified type   2. Gastroesophageal reflux disease without esophagitis   3. Essential hypertension   4. Obesity, morbid, BMI 40.0-49.9 (  HCC)   5. Snoring   6. Chronic fatigue     PLAN:   1. Chest pain, unspecified type 2. Gastroesophageal reflux disease without esophagitis -Atypical chest pain.  Sharp and dull at times.  Better with exercise.  Associated with some back pain.  I suspect this is either musculoskeletal pain from obesity versus acid reflux.  We will start him on Prilosec 20 mg daily.  His EKG today demonstrates normal sinus rhythm with no acute ST-T changes.  His cardiovascular examination is without murmurs rubs or gallops.  I see no need for further cardiac evaluation.    3. Essential hypertension -Acceptable today.  I do wonder about sleep apnea.  He will continue amlodipine 5 mg and HCTZ.  He has had hypokalemia in the past and this is all related to his HCTZ.  His primary care physician could easily choose to stop this and put him on an ACE or an ARB if they deem this is needed  4. Obesity, morbid, BMI 40.0-49.9 (HCC) 5. Snoring 6. Chronic fatigue -I suspect he has sleep apnea.  He is morbidly obese he is excessively tired fatigue.  This could explain his blood pressure.  We will order home sleep study.  I will follow the results with him by phone.  Disposition: Return if symptoms worsen or fail to improve.  Medication Adjustments/Labs and Tests Ordered: Current medicines are reviewed at length with the patient today.  Concerns regarding medicines are outlined above.  Orders Placed This Encounter  Procedures  .  EKG 12-Lead  . Home sleep test   Meds ordered this encounter  Medications  . omeprazole (PRILOSEC) 20 MG capsule    Sig: Take 1 capsule (20 mg total) by mouth daily.    Dispense:  90 capsule    Refill:  1    Patient Instructions  Medication Instructions:  Start Prilosec 20 mg daily   *If you need a refill on your cardiac medications before your next appointment, please call your pharmacy*  Testing/Procedures: Your physician has recommended that you have a sleep study. This test records several body functions during sleep, including: brain activity, eye movement, oxygen and carbon dioxide blood levels, heart rate and rhythm, breathing rate and rhythm, the flow of air through your mouth and nose, snoring, body muscle movements, and chest and belly movement.   Follow-Up: At [facility], you and your health needs are our priority.  As part of our continuing mission to provide you with exceptional heart care, we have created designated Provider Care Teams.  These Care Teams include your primary Cardiologist (physician) and Advanced Practice Providers (APPs -  Physician Assistants and Nurse Practitioners) who all work together to provide you with the care you need, when you need it.  We recommend signing up for the patient portal called "MyChart".  Sign up information is provided on this After Visit Summary.  MyChart is used to connect with patients for Virtual Visits (Telemedicine).  Patients are able to view lab/test results, encounter notes, upcoming appointments, etc.  Non-urgent messages can be sent to your provider as well.   To learn more about what you can do with MyChart, go to [email or web address].    Your next appointment:   As needed  The format for your next appointment:   In Person  Provider:   Lennie Odor, MD       Signed, Lenna Gilford. Flora Lipps, MD [facility]  [address] [place], [place] [address] [phone]  [phone]  07/17/2019 12:10  PM

## 2019-07-17 ENCOUNTER — Other Ambulatory Visit: Payer: Self-pay

## 2019-07-17 ENCOUNTER — Ambulatory Visit (INDEPENDENT_AMBULATORY_CARE_PROVIDER_SITE_OTHER): Payer: 59 | Admitting: Cardiovascular Disease

## 2019-07-17 ENCOUNTER — Encounter: Payer: Self-pay | Admitting: Cardiovascular Disease

## 2019-07-17 VITALS — BP 130/90 | HR 78 | Temp 97.5°F | Ht 72.0 in | Wt 358.0 lb

## 2019-07-17 DIAGNOSIS — R079 Chest pain, unspecified: Secondary | ICD-10-CM | POA: Diagnosis not present

## 2019-07-17 DIAGNOSIS — R0683 Snoring: Secondary | ICD-10-CM | POA: Diagnosis not present

## 2019-07-17 DIAGNOSIS — I10 Essential (primary) hypertension: Secondary | ICD-10-CM | POA: Diagnosis not present

## 2019-07-17 DIAGNOSIS — R5382 Chronic fatigue, unspecified: Secondary | ICD-10-CM

## 2019-07-17 DIAGNOSIS — K219 Gastro-esophageal reflux disease without esophagitis: Secondary | ICD-10-CM

## 2019-07-17 MED ORDER — OMEPRAZOLE 20 MG PO CPDR: 20.0000 mg | DELAYED_RELEASE_CAPSULE | Freq: Every day | ORAL | 1 refills | Status: AC

## 2019-07-17 MED FILL — OMEPRAZOLE DR 20 MG CAPSULE: 20 | 90 days supply | Qty: 90 | Fill #0

## 2019-07-17 NOTE — Patient Instructions (Signed)
Medication Instructions:  Start Prilosec 20 mg daily   *If you need a refill on your cardiac medications before your next appointment, please call your pharmacy*  Testing/Procedures: Your physician has recommended that you have a sleep study. This test records several body functions during sleep, including: brain activity, eye movement, oxygen and carbon dioxide blood levels, heart rate and rhythm, breathing rate and rhythm, the flow of air through your mouth and nose, snoring, body muscle movements, and chest and belly movement.   Follow-Up: At [facility], you and your health needs are our priority.  As part of our continuing mission to provide you with exceptional heart care, we have created designated Provider Care Teams.  These Care Teams include your primary Cardiologist (physician) and Advanced Practice Providers (APPs -  Physician Assistants and Nurse Practitioners) who all work together to provide you with the care you need, when you need it.  We recommend signing up for the patient portal called "MyChart".  Sign up information is provided on this After Visit Summary.  MyChart is used to connect with patients for Virtual Visits (Telemedicine).  Patients are able to view lab/test results, encounter notes, upcoming appointments, etc.  Non-urgent messages can be sent to your provider as well.   To learn more about what you can do with MyChart, go to [email or web address].    Your next appointment:   As needed  The format for your next appointment:   In Person  Provider:   Lennie Odor, MD

## 2019-07-19 MED FILL — AMLODIPINE-OLMESARTAN 10-20: 10-20 | 60 days supply | Qty: 60 | Fill #1

## 2019-07-19 MED FILL — HYDROCHLOROTHIAZIDE 25 MG T: 25 | 60 days supply | Qty: 60 | Fill #0

## 2019-07-31 ENCOUNTER — Other Ambulatory Visit (HOSPITAL_COMMUNITY): Payer: Self-pay | Admitting: Physician Assistant

## 2019-07-31 DIAGNOSIS — J3089 Other allergic rhinitis: Secondary | ICD-10-CM | POA: Diagnosis not present

## 2019-07-31 DIAGNOSIS — K219 Gastro-esophageal reflux disease without esophagitis: Secondary | ICD-10-CM | POA: Diagnosis not present

## 2019-07-31 DIAGNOSIS — I10 Essential (primary) hypertension: Secondary | ICD-10-CM | POA: Diagnosis not present

## 2019-07-31 MED FILL — MOMETASONE FUROATE 50 MCG S: 50 | 30 days supply | Qty: 17 | Fill #0

## 2019-09-27 MED FILL — AMLODIPINE-OLMESARTAN 10-20: 10-20 | 60 days supply | Qty: 60 | Fill #0

## 2019-09-27 MED FILL — HYDROCHLOROTHIAZIDE 25 MG T: 25 | 90 days supply | Qty: 90 | Fill #0

## 2019-12-09 MED FILL — AMLODIPINE-OLMESARTAN 10-20: 10-20 | 60 days supply | Qty: 60 | Fill #1

## 2019-12-09 MED FILL — HYDROCHLOROTHIAZIDE 25 MG T: 25 | 90 days supply | Qty: 90 | Fill #1

## 2020-02-17 MED FILL — AMLODIPINE-OLMESARTAN 10-20: 10-20 | 30 days supply | Qty: 30 | Fill #2

## 2020-02-20 ENCOUNTER — Other Ambulatory Visit (HOSPITAL_COMMUNITY): Payer: Self-pay | Admitting: Physician Assistant

## 2020-02-20 DIAGNOSIS — Z0000 Encounter for general adult medical examination without abnormal findings: Secondary | ICD-10-CM | POA: Diagnosis not present

## 2020-02-20 DIAGNOSIS — Z6841 Body Mass Index (BMI) 40.0 and over, adult: Secondary | ICD-10-CM | POA: Diagnosis not present

## 2020-02-20 DIAGNOSIS — R310 Gross hematuria: Secondary | ICD-10-CM | POA: Diagnosis not present

## 2020-02-20 DIAGNOSIS — Z13220 Encounter for screening for lipoid disorders: Secondary | ICD-10-CM | POA: Diagnosis not present

## 2020-02-20 DIAGNOSIS — Z1321 Encounter for screening for nutritional disorder: Secondary | ICD-10-CM | POA: Diagnosis not present

## 2020-02-20 DIAGNOSIS — Z13228 Encounter for screening for other metabolic disorders: Secondary | ICD-10-CM | POA: Diagnosis not present

## 2020-02-20 DIAGNOSIS — Z9189 Other specified personal risk factors, not elsewhere classified: Secondary | ICD-10-CM | POA: Diagnosis not present

## 2020-02-20 DIAGNOSIS — Z1329 Encounter for screening for other suspected endocrine disorder: Secondary | ICD-10-CM | POA: Diagnosis not present

## 2020-02-20 DIAGNOSIS — R0683 Snoring: Secondary | ICD-10-CM | POA: Diagnosis not present

## 2020-02-20 DIAGNOSIS — I10 Essential (primary) hypertension: Secondary | ICD-10-CM | POA: Diagnosis not present

## 2020-02-20 DIAGNOSIS — Z79899 Other long term (current) drug therapy: Secondary | ICD-10-CM | POA: Diagnosis not present

## 2020-02-20 DIAGNOSIS — Z87448 Personal history of other diseases of urinary system: Secondary | ICD-10-CM | POA: Diagnosis not present

## 2020-02-20 MED FILL — HYDROCHLOROTHIAZIDE 25 MG T: 25 | 90 days supply | Qty: 90 | Fill #0

## 2020-03-25 MED FILL — HYDROCHLOROTHIAZIDE 25 MG T: 25 | 90 days supply | Qty: 90 | Fill #2

## 2020-03-25 MED FILL — AMLODIPINE-OLMESARTAN 10-20: 10-20 | 90 days supply | Qty: 90 | Fill #0

## 2020-04-16 DIAGNOSIS — R400 Somnolence: Secondary | ICD-10-CM | POA: Diagnosis not present

## 2020-04-16 DIAGNOSIS — R0683 Snoring: Secondary | ICD-10-CM | POA: Diagnosis not present

## 2020-05-22 DIAGNOSIS — G4730 Sleep apnea, unspecified: Secondary | ICD-10-CM | POA: Diagnosis not present

## 2020-07-20 ENCOUNTER — Other Ambulatory Visit (HOSPITAL_COMMUNITY): Payer: Self-pay

## 2020-07-20 MED FILL — Amlodipine Besylate-Olmesartan Medoxomil Tab 10-20 MG: ORAL | 90 days supply | Qty: 90 | Fill #0 | Status: AC

## 2020-07-20 MED FILL — Hydrochlorothiazide Tab 25 MG: ORAL | 90 days supply | Qty: 90 | Fill #0 | Status: AC

## 2020-08-11 DIAGNOSIS — Z6841 Body Mass Index (BMI) 40.0 and over, adult: Secondary | ICD-10-CM | POA: Diagnosis not present

## 2020-08-11 DIAGNOSIS — R310 Gross hematuria: Secondary | ICD-10-CM | POA: Diagnosis not present

## 2020-08-11 DIAGNOSIS — N35914 Unspecified anterior urethral stricture, male: Secondary | ICD-10-CM | POA: Diagnosis not present

## 2020-08-19 DIAGNOSIS — G4733 Obstructive sleep apnea (adult) (pediatric): Secondary | ICD-10-CM | POA: Diagnosis not present

## 2020-08-20 DIAGNOSIS — Z6841 Body Mass Index (BMI) 40.0 and over, adult: Secondary | ICD-10-CM | POA: Diagnosis not present

## 2020-08-20 DIAGNOSIS — I10 Essential (primary) hypertension: Secondary | ICD-10-CM | POA: Diagnosis not present

## 2020-08-20 DIAGNOSIS — R7989 Other specified abnormal findings of blood chemistry: Secondary | ICD-10-CM | POA: Diagnosis not present

## 2020-08-27 DIAGNOSIS — E0590 Thyrotoxicosis, unspecified without thyrotoxic crisis or storm: Secondary | ICD-10-CM | POA: Diagnosis not present

## 2020-09-18 DIAGNOSIS — R310 Gross hematuria: Secondary | ICD-10-CM | POA: Diagnosis not present

## 2020-09-18 DIAGNOSIS — Z6841 Body Mass Index (BMI) 40.0 and over, adult: Secondary | ICD-10-CM | POA: Diagnosis not present

## 2020-09-18 DIAGNOSIS — N35812 Other urethral bulbous stricture, male: Secondary | ICD-10-CM | POA: Diagnosis not present

## 2020-09-18 DIAGNOSIS — N35814 Other anterior urethral stricture, male: Secondary | ICD-10-CM | POA: Diagnosis not present

## 2020-09-18 DIAGNOSIS — N35914 Unspecified anterior urethral stricture, male: Secondary | ICD-10-CM | POA: Diagnosis not present

## 2020-09-19 DIAGNOSIS — G4733 Obstructive sleep apnea (adult) (pediatric): Secondary | ICD-10-CM | POA: Diagnosis not present

## 2020-09-29 DIAGNOSIS — N3000 Acute cystitis without hematuria: Secondary | ICD-10-CM | POA: Diagnosis not present

## 2020-09-29 DIAGNOSIS — N35914 Unspecified anterior urethral stricture, male: Secondary | ICD-10-CM | POA: Diagnosis not present

## 2020-09-29 DIAGNOSIS — G4733 Obstructive sleep apnea (adult) (pediatric): Secondary | ICD-10-CM | POA: Diagnosis not present

## 2020-09-29 DIAGNOSIS — Z9989 Dependence on other enabling machines and devices: Secondary | ICD-10-CM | POA: Diagnosis not present

## 2020-10-20 ENCOUNTER — Other Ambulatory Visit (HOSPITAL_COMMUNITY): Payer: Self-pay

## 2020-10-20 DIAGNOSIS — I10 Essential (primary) hypertension: Secondary | ICD-10-CM | POA: Diagnosis not present

## 2020-10-20 DIAGNOSIS — G4733 Obstructive sleep apnea (adult) (pediatric): Secondary | ICD-10-CM | POA: Diagnosis not present

## 2020-10-20 DIAGNOSIS — F439 Reaction to severe stress, unspecified: Secondary | ICD-10-CM | POA: Diagnosis not present

## 2020-10-20 DIAGNOSIS — R600 Localized edema: Secondary | ICD-10-CM | POA: Diagnosis not present

## 2020-10-20 MED ORDER — CHLORTHALIDONE 25 MG PO TABS
ORAL_TABLET | ORAL | 0 refills | Status: DC
  Filled 2020-10-20: qty 30, 30d supply, fill #0

## 2020-10-23 DIAGNOSIS — N35814 Other anterior urethral stricture, male: Secondary | ICD-10-CM | POA: Diagnosis not present

## 2020-10-23 DIAGNOSIS — R338 Other retention of urine: Secondary | ICD-10-CM | POA: Diagnosis not present

## 2020-10-23 DIAGNOSIS — Z6841 Body Mass Index (BMI) 40.0 and over, adult: Secondary | ICD-10-CM | POA: Diagnosis not present

## 2020-10-23 DIAGNOSIS — R3912 Poor urinary stream: Secondary | ICD-10-CM | POA: Diagnosis not present

## 2020-10-25 ENCOUNTER — Emergency Department (HOSPITAL_COMMUNITY)
Admission: EM | Admit: 2020-10-25 | Discharge: 2020-10-26 | Disposition: A | Discharge status: Home / Self Care | Payer: 59 | Attending: Emergency Medicine | Admitting: Emergency Medicine

## 2020-10-25 ENCOUNTER — Other Ambulatory Visit: Payer: Self-pay

## 2020-10-25 ENCOUNTER — Encounter (HOSPITAL_COMMUNITY): Payer: Self-pay | Admitting: Emergency Medicine

## 2020-10-25 ENCOUNTER — Emergency Department (HOSPITAL_COMMUNITY): Payer: 59

## 2020-10-25 DIAGNOSIS — Z79899 Other long term (current) drug therapy: Secondary | ICD-10-CM | POA: Insufficient documentation

## 2020-10-25 DIAGNOSIS — R072 Precordial pain: Secondary | ICD-10-CM | POA: Insufficient documentation

## 2020-10-25 DIAGNOSIS — I10 Essential (primary) hypertension: Secondary | ICD-10-CM | POA: Insufficient documentation

## 2020-10-25 DIAGNOSIS — Z7982 Long term (current) use of aspirin: Secondary | ICD-10-CM | POA: Diagnosis not present

## 2020-10-25 DIAGNOSIS — F419 Anxiety disorder, unspecified: Secondary | ICD-10-CM | POA: Insufficient documentation

## 2020-10-25 DIAGNOSIS — R0789 Other chest pain: Secondary | ICD-10-CM | POA: Diagnosis not present

## 2020-10-25 LAB — CBC
HCT: 36.8 % — ABNORMAL LOW (ref 39.0–52.0)
Hemoglobin: 11.8 g/dL — ABNORMAL LOW (ref 13.0–17.0)
MCH: 28.8 pg (ref 26.0–34.0)
MCHC: 32.1 g/dL (ref 30.0–36.0)
MCV: 89.8 fL (ref 80.0–100.0)
Platelets: 352 10*3/uL (ref 150–400)
RBC: 4.1 MIL/uL — ABNORMAL LOW (ref 4.22–5.81)
RDW: 14 % (ref 11.5–15.5)
WBC: 10.1 10*3/uL (ref 4.0–10.5)
nRBC: 0 % (ref 0.0–0.2)

## 2020-10-25 NOTE — ED Triage Notes (Signed)
Pt c/o dull chest left sided chest pressure X3 days.  No other symptoms at this time.  Pain was a 6/10 but since the wait the pain has resolved.

## 2020-10-26 ENCOUNTER — Encounter (HOSPITAL_COMMUNITY): Payer: Self-pay | Admitting: Emergency Medicine

## 2020-10-26 ENCOUNTER — Other Ambulatory Visit (HOSPITAL_COMMUNITY): Payer: Self-pay

## 2020-10-26 DIAGNOSIS — R072 Precordial pain: Secondary | ICD-10-CM | POA: Diagnosis not present

## 2020-10-26 DIAGNOSIS — F411 Generalized anxiety disorder: Secondary | ICD-10-CM | POA: Diagnosis not present

## 2020-10-26 DIAGNOSIS — I10 Essential (primary) hypertension: Secondary | ICD-10-CM | POA: Diagnosis not present

## 2020-10-26 DIAGNOSIS — Z7982 Long term (current) use of aspirin: Secondary | ICD-10-CM | POA: Diagnosis not present

## 2020-10-26 DIAGNOSIS — F321 Major depressive disorder, single episode, moderate: Secondary | ICD-10-CM | POA: Diagnosis not present

## 2020-10-26 DIAGNOSIS — F419 Anxiety disorder, unspecified: Secondary | ICD-10-CM | POA: Diagnosis not present

## 2020-10-26 DIAGNOSIS — Z79899 Other long term (current) drug therapy: Secondary | ICD-10-CM | POA: Diagnosis not present

## 2020-10-26 LAB — BASIC METABOLIC PANEL
Anion gap: 9 (ref 5–15)
BUN: 6 mg/dL (ref 6–20)
CO2: 26 mmol/L (ref 22–32)
Calcium: 9.6 mg/dL (ref 8.9–10.3)
Chloride: 100 mmol/L (ref 98–111)
Creatinine, Ser: 0.97 mg/dL (ref 0.61–1.24)
GFR, Estimated: >60 mL/min (ref >60)
Glucose, Bld: 110 mg/dL — ABNORMAL HIGH (ref 70–99)
Potassium: 3.2 mmol/L — ABNORMAL LOW (ref 3.5–5.1)
Sodium: 135 mmol/L (ref 135–145)

## 2020-10-26 LAB — TROPONIN I (HIGH SENSITIVITY)
Troponin I (High Sensitivity): 4 ng/L (ref <18)
Troponin I (High Sensitivity): 4 ng/L (ref <18)

## 2020-10-26 MED ORDER — POTASSIUM CHLORIDE CRYS ER 20 MEQ PO TBCR
40.0000 meq | EXTENDED_RELEASE_TABLET | Freq: Once | ORAL | Status: AC
  Administered 2020-10-26: 40 meq via ORAL
  Filled 2020-10-26: qty 2

## 2020-10-26 MED ORDER — FLUOXETINE HCL 10 MG PO CAPS
10.0000 mg | ORAL_CAPSULE | Freq: Every morning | ORAL | 0 refills | Status: AC
  Filled 2020-10-26: qty 30, 30d supply, fill #0

## 2020-10-26 MED ORDER — ALUM & MAG HYDROXIDE-SIMETH 200-200-20 MG/5ML PO SUSP
30.0000 mL | Freq: Once | ORAL | Status: AC
  Administered 2020-10-26: 30 mL via ORAL
  Filled 2020-10-26: qty 30

## 2020-10-26 NOTE — ED Notes (Signed)
E-signature pad unavailable at time of pt discharge. This RN discussed discharge materials with pt and answered all pt questions. Pt stated understanding of discharge material. ? ?

## 2020-10-26 NOTE — ED Provider Notes (Signed)
[facility] EMERGENCY DEPARTMENT Provider Note   CSN: [id number] Arrival date & time: 10/25/20  2212     History No chief complaint on file.   Jerry Butler is a 35 y.o. male.  The history is provided by the patient.  Chest Pain Pain location:  Substernal area Pain quality: dull   Pain radiates to:  Does not radiate Pain severity:  Moderate Onset quality:  Gradual Duration:  5 days Timing:  Constant Progression:  Waxing and waning Chronicity:  New Context: at rest   Relieved by:  Nothing Worsened by:  Nothing Ineffective treatments:  None tried Associated symptoms: anxiety   Associated symptoms: no abdominal pain, no AICD problem, no altered mental status, no back pain, no cough, no dizziness, no fever, no lower extremity edema, no nausea, no numbness, no orthopnea, no PND, no shortness of breath, no vomiting and no weakness   Associated symptoms comment:  Since starting a new medication regimen the day the pain started.   Risk factors: male sex   Patient with HTN who presents with chest pain, dull in nature without radiation since Wednesday.  He started a new medication regimen that day and has felt anxious since that time.  No SOB,  No DOE.  No n/v/d.  No leg pain or swelling.  No travel.     Past Medical History:  Diagnosis Date   Hypertension    UTI (urinary tract infection)     There are no problems to display for this patient.   Past Surgical History:  Procedure Laterality Date   CYSTOSCOPY         Family History  Problem Relation Age of Onset   Hypertension Mother     Social History   Tobacco Use   Smoking status: Never   Smokeless tobacco: Never  Substance Use Topics   Alcohol use: No   Drug use: No    Home Medications Prior to Admission medications   Medication Sig Start Date End Date Taking? Authorizing Provider  aspirin EC 81 MG tablet Take 81-162 mg by mouth as needed (chest pain). Swallow whole.   Yes [provider]  chlorthalidone (HYGROTON) 25 MG tablet Take 0.5 tablets (12.5 mg total) by mouth daily for 3 days, THEN 1 tablet (25 mg total) daily. 10/20/20 11/22/20 Yes   NON FORMULARY CPAP at bedtime   Yes [provider]  omeprazole (PRILOSEC) 20 MG capsule Take 1 capsule (20 mg total) by mouth daily. Patient taking differently: Take 20 mg by mouth daily as needed (heartburn). 07/17/19  Yes O'Neal, Ronnald Ramp, MD  acetaminophen (TYLENOL) 500 MG tablet Take 1 tablet (500 mg total) by mouth every 6 (six) hours as needed. Patient not taking: No sig reported 07/10/19   Michela Pitcher A, PA-C  amLODipine (NORVASC) 5 MG tablet Take 5 mg by mouth daily. Patient not taking: No sig reported 02/19/19   [provider]  amlodipine-olmesartan (AZOR) 10-20 MG tablet TAKE 1 TABLET BY MOUTH DAILY. Patient not taking: No sig reported 02/20/20 02/19/21  Sherrie Mustache T, PA-C  amlodipine-olmesartan (AZOR) 10-20 MG tablet TAKE 1 TABLET BY MOUTH EVERY DAY Patient not taking: Reported on 10/26/2020 03/27/19 03/26/20  Lenord Fellers, PA-C  hydrochlorothiazide (HYDRODIURIL) 25 MG tablet TAKE 1 TABLET (25 MG TOTAL) BY MOUTH DAILY. Patient not taking: No sig reported 02/20/20 02/19/21  Sherrie Mustache T, PA-C  hydrochlorothiazide (HYDRODIURIL) 25 MG tablet TAKE 1 TABLET BY MOUTH ONCE DAILY. Patient not taking: Reported on  10/26/2020 07/31/19 10/18/20  Sherrie Mustache T, PA-C  ibuprofen (ADVIL) 600 MG tablet Take 1 tablet (600 mg total) by mouth every 6 (six) hours as needed. Patient not taking: No sig reported 07/10/19   Michela Pitcher A, PA-C    Allergies    Amoxicillin  Review of Systems   Review of Systems  Constitutional:  Negative for fever.  HENT:  Negative for facial swelling.   Eyes:  Negative for redness.  Respiratory:  Negative for cough and shortness of breath.   Cardiovascular:  Positive for chest pain. Negative for orthopnea and PND.  Gastrointestinal:  Negative for abdominal pain, nausea  and vomiting.  Genitourinary:  Negative for difficulty urinating.  Musculoskeletal:  Negative for back pain.  Skin:  Negative for rash.  Neurological:  Negative for dizziness, weakness and numbness.  Psychiatric/Behavioral:  Negative for agitation.    Physical Exam Updated Vital Signs BP 131/70   Pulse 60   Temp 98.6 F (37 C) (Oral)   Resp 16   Ht 5\' 11"  (1.803 m)   Wt (!) 165.6 kg   SpO2 95%   BMI 50.91 kg/m   Physical Exam Vitals and nursing note reviewed.  Constitutional:      General: He is not in acute distress.    Appearance: Normal appearance. He is not diaphoretic.  HENT:     Head: Normocephalic and atraumatic.     Nose: Nose normal.  Eyes:     Conjunctiva/sclera: Conjunctivae normal.     Pupils: Pupils are equal, round, and reactive to light.  Cardiovascular:     Rate and Rhythm: Normal rate and regular rhythm.     Pulses: Normal pulses.     Heart sounds: Normal heart sounds.  Pulmonary:     Effort: Pulmonary effort is normal.     Breath sounds: Normal breath sounds.  Abdominal:     General: Abdomen is flat. Bowel sounds are normal.     Palpations: Abdomen is soft.     Tenderness: There is no abdominal tenderness. There is no guarding.  Musculoskeletal:        General: No tenderness. Normal range of motion.     Cervical back: Normal range of motion and neck supple.     Right lower leg: No edema.     Left lower leg: No edema.  Skin:    General: Skin is warm and dry.     Capillary Refill: Capillary refill takes less than 2 seconds.  Neurological:     General: No focal deficit present.     Mental Status: He is alert and oriented to person, place, and time.     Deep Tendon Reflexes: Reflexes normal.  Psychiatric:        Mood and Affect: Mood is anxious.    ED Results / Procedures / Treatments   Labs (all labs ordered are listed, but only abnormal results are displayed) Results for orders placed or performed during the hospital encounter of 10/25/20   Basic metabolic panel  Result Value Ref Range   Sodium 135 135 - 145 mmol/L   Potassium 3.2 (L) 3.5 - 5.1 mmol/L   Chloride 100 98 - 111 mmol/L   CO2 26 22 - 32 mmol/L   Glucose, Bld 110 (H) 70 - 99 mg/dL   BUN 6 6 - 20 mg/dL   Creatinine, Ser 12/25/20 0.61 - 1.24 mg/dL   Calcium 9.6 8.9 - 4.13 mg/dL   GFR, Estimated 24.4 >01 mL/min   Anion gap 9  5 - 15  CBC  Result Value Ref Range   WBC 10.1 4.0 - 10.5 K/uL   RBC 4.10 (L) 4.22 - 5.81 MIL/uL   Hemoglobin 11.8 (L) 13.0 - 17.0 g/dL   HCT 37.1 (L) 69.6 - 78.9 %   MCV 89.8 80.0 - 100.0 fL   MCH 28.8 26.0 - 34.0 pg   MCHC 32.1 30.0 - 36.0 g/dL   RDW 38.1 01.7 - 51.0 %   Platelets 352 150 - 400 K/uL   nRBC 0.0 0.0 - 0.2 %  Troponin I (High Sensitivity)  Result Value Ref Range   Troponin I (High Sensitivity) 4 <18 ng/L  Troponin I (High Sensitivity)  Result Value Ref Range   Troponin I (High Sensitivity) 4 <18 ng/L   DG Chest 2 View  Result Date: 10/25/2020 CLINICAL DATA:  Left-sided chest pressure. EXAM: CHEST - 2 VIEW COMPARISON:  July 10, 2019 FINDINGS: The heart size and mediastinal contours are within normal limits. Both lungs are clear. The visualized skeletal structures are unremarkable. IMPRESSION: No active cardiopulmonary disease. Electronically Signed   By: Aram Candela M.D.   On: 10/25/2020 23:39    EKG EKG Interpretation  Date/Time:  Sunday October 25 2020 23:10:47 EDT Ventricular Rate:  77 PR Interval:  192 QRS Duration: 94 QT Interval:  370 QTC Calculation: 418 R Axis:   66 Text Interpretation: Normal sinus rhythm ST T changes, old, seen on all previous EKGs Confirmed by Nicanor Alcon, Elisheba Mcdonnell ([id number]) on 10/26/2020 3:01:37 AM  Radiology DG Chest 2 View  Result Date: 10/25/2020 CLINICAL DATA:  Left-sided chest pressure. EXAM: CHEST - 2 VIEW COMPARISON:  July 10, 2019 FINDINGS: The heart size and mediastinal contours are within normal limits. Both lungs are clear. The visualized skeletal structures are unremarkable.  IMPRESSION: No active cardiopulmonary disease. Electronically Signed   By: Aram Candela M.D.   On: 10/25/2020 23:39    Procedures Procedures   Medications Ordered in ED Medications  alum & mag hydroxide-simeth (MAALOX/MYLANTA) 200-200-20 MG/5ML suspension 30 mL (30 mLs Oral Given 10/26/20 0317)  potassium chloride SA (KLOR-CON) CR tablet 40 mEq (40 mEq Oral Given 10/26/20 0443)    ED Course  I have reviewed the triage vital signs and the nursing notes.  Pertinent labs & imaging results that were available during my care of the patient were reviewed by me and considered in my medical decision making (see chart for details).   Atypical history for ACS.  Likely anxiety because after 5 days of ongoing symptoms, the symptoms resolved without intervention post triage in the ER.  PERC negative wells 0 highly doubt PE in this low risk patient.  HEART score is 2 low risk for MACE.  Ruled out for MI in the ED.   Dryden Tapley was evaluated in Emergency Department on 10/26/2020 for the symptoms described in the history of present illness. He was evaluated in the context of the global COVID-19 pandemic, which necessitated consideration that the patient might be at risk for infection with the SARS-CoV-2 virus that causes COVID-19. Institutional protocols and algorithms that pertain to the evaluation of patients at risk for COVID-19 are in a state of rapid change based on information released by regulatory bodies including the CDC and federal and state organizations. These policies and algorithms were followed during the patient's care in the ED.  Final Clinical Impression(s) / ED Diagnoses Final diagnoses:  None       Return for intractable cough, coughing up blood, fevers > 100.4  unrelieved by medication, shortness of breath, intractable vomiting, chest pain, shortness of breath, weakness, numbness, changes in speech, facial asymmetry, abdominal pain, passing out, Inability to tolerate liquids or  food, cough, altered mental status or any concerns. No signs of systemic illness or infection. The patient is nontoxic-appearing on exam and vital signs are within normal limits. I have reviewed the triage vital signs and the nursing notes. Pertinent labs & imaging results that were available during my care of the patient were reviewed by me and considered in my medical decision making (see chart for details). After history, exam, and medical workup I feel the patient has been appropriately medically screened and is safe for discharge home. Pertinent diagnoses were discussed with the patient. Patient was given return precautions. Rx / DC Orders ED Discharge Orders     None        Jamila Slatten, MD 10/26/20 [phone]

## 2020-11-03 ENCOUNTER — Other Ambulatory Visit (HOSPITAL_COMMUNITY): Payer: Self-pay

## 2020-11-04 DIAGNOSIS — R600 Localized edema: Secondary | ICD-10-CM | POA: Diagnosis not present

## 2020-11-04 DIAGNOSIS — I10 Essential (primary) hypertension: Secondary | ICD-10-CM | POA: Diagnosis not present

## 2020-11-18 ENCOUNTER — Other Ambulatory Visit (HOSPITAL_COMMUNITY): Payer: Self-pay

## 2020-11-18 MED ORDER — CHLORTHALIDONE 25 MG PO TABS
ORAL_TABLET | ORAL | 0 refills | Status: DC
  Filled 2020-11-18: qty 30, 30d supply, fill #0

## 2020-11-19 DIAGNOSIS — G4733 Obstructive sleep apnea (adult) (pediatric): Secondary | ICD-10-CM | POA: Diagnosis not present

## 2020-11-21 DIAGNOSIS — G4733 Obstructive sleep apnea (adult) (pediatric): Secondary | ICD-10-CM | POA: Diagnosis not present

## 2020-11-30 ENCOUNTER — Other Ambulatory Visit (HOSPITAL_COMMUNITY): Payer: Self-pay

## 2020-11-30 DIAGNOSIS — R82998 Other abnormal findings in urine: Secondary | ICD-10-CM | POA: Diagnosis not present

## 2020-11-30 DIAGNOSIS — R319 Hematuria, unspecified: Secondary | ICD-10-CM | POA: Diagnosis not present

## 2020-11-30 DIAGNOSIS — N390 Urinary tract infection, site not specified: Secondary | ICD-10-CM | POA: Diagnosis not present

## 2020-11-30 DIAGNOSIS — I10 Essential (primary) hypertension: Secondary | ICD-10-CM | POA: Diagnosis not present

## 2020-11-30 MED ORDER — CIPROFLOXACIN HCL 500 MG PO TABS
500.0000 mg | ORAL_TABLET | Freq: Two times a day (BID) | ORAL | 0 refills | Status: AC
  Filled 2020-11-30: qty 20, 10d supply, fill #0

## 2020-11-30 MED ORDER — FLUCONAZOLE 150 MG PO TABS
150.0000 mg | ORAL_TABLET | Freq: Every day | ORAL | 0 refills | Status: AC
  Filled 2020-11-30: qty 3, 3d supply, fill #0

## 2020-12-21 ENCOUNTER — Other Ambulatory Visit (HOSPITAL_COMMUNITY): Payer: Self-pay

## 2020-12-21 MED ORDER — CHLORTHALIDONE 25 MG PO TABS
ORAL_TABLET | ORAL | 0 refills | Status: DC
  Filled 2020-12-21: qty 30, 30d supply, fill #0

## 2020-12-24 DIAGNOSIS — R3912 Poor urinary stream: Secondary | ICD-10-CM | POA: Diagnosis not present

## 2020-12-24 DIAGNOSIS — R339 Retention of urine, unspecified: Secondary | ICD-10-CM | POA: Diagnosis not present

## 2020-12-24 DIAGNOSIS — N35914 Unspecified anterior urethral stricture, male: Secondary | ICD-10-CM | POA: Diagnosis not present

## 2021-01-11 DIAGNOSIS — N35914 Unspecified anterior urethral stricture, male: Secondary | ICD-10-CM | POA: Diagnosis not present

## 2021-01-11 DIAGNOSIS — Z01812 Encounter for preprocedural laboratory examination: Secondary | ICD-10-CM | POA: Diagnosis not present

## 2021-01-19 ENCOUNTER — Other Ambulatory Visit (HOSPITAL_COMMUNITY): Payer: Self-pay

## 2021-01-19 DIAGNOSIS — R600 Localized edema: Secondary | ICD-10-CM | POA: Diagnosis not present

## 2021-01-19 DIAGNOSIS — I872 Venous insufficiency (chronic) (peripheral): Secondary | ICD-10-CM | POA: Diagnosis not present

## 2021-01-19 MED ORDER — CHLORTHALIDONE 25 MG PO TABS
25.0000 mg | ORAL_TABLET | Freq: Every day | ORAL | 0 refills | Status: AC
  Filled 2021-12-30: qty 30, 30d supply, fill #0

## 2021-01-19 MED ORDER — BETAMETHASONE DIPROPIONATE 0.05 % EX OINT
1.0000 "application " | TOPICAL_OINTMENT | Freq: Two times a day (BID) | CUTANEOUS | 0 refills | Status: AC
  Filled 2021-01-19: qty 15, 10d supply, fill #0

## 2021-01-19 MED ORDER — CHLORTHALIDONE 25 MG PO TABS
25.0000 mg | ORAL_TABLET | Freq: Every day | ORAL | 2 refills | Status: AC
  Filled 2021-02-19: qty 90, 90d supply, fill #0
  Filled 2021-05-19: qty 90, 90d supply, fill #1
  Filled 2021-09-10: qty 90, 90d supply, fill #2

## 2021-01-19 MED ORDER — DOXYCYCLINE HYCLATE 100 MG PO CAPS
100.0000 mg | ORAL_CAPSULE | Freq: Two times a day (BID) | ORAL | 0 refills | Status: AC
  Filled 2021-01-19: qty 14, 7d supply, fill #0

## 2021-01-19 MED ORDER — CHLORTHALIDONE 25 MG PO TABS
ORAL_TABLET | ORAL | 0 refills | Status: AC
  Filled 2021-01-19: qty 30, 30d supply, fill #0

## 2021-01-23 DIAGNOSIS — Z760 Encounter for issue of repeat prescription: Secondary | ICD-10-CM | POA: Diagnosis not present

## 2021-02-01 ENCOUNTER — Other Ambulatory Visit (HOSPITAL_COMMUNITY): Payer: Self-pay

## 2021-02-01 DIAGNOSIS — R3912 Poor urinary stream: Secondary | ICD-10-CM | POA: Diagnosis not present

## 2021-02-01 DIAGNOSIS — N35912 Unspecified bulbous urethral stricture, male: Secondary | ICD-10-CM | POA: Diagnosis not present

## 2021-02-01 DIAGNOSIS — R339 Retention of urine, unspecified: Secondary | ICD-10-CM | POA: Diagnosis not present

## 2021-02-01 DIAGNOSIS — N35914 Unspecified anterior urethral stricture, male: Secondary | ICD-10-CM | POA: Diagnosis not present

## 2021-02-01 MED ORDER — PHENAZOPYRIDINE HCL 200 MG PO TABS
200.0000 mg | ORAL_TABLET | Freq: Three times a day (TID) | ORAL | 0 refills | Status: AC
  Filled 2021-02-01: qty 9, 3d supply, fill #0

## 2021-02-04 DIAGNOSIS — N35914 Unspecified anterior urethral stricture, male: Secondary | ICD-10-CM | POA: Diagnosis not present

## 2021-02-19 ENCOUNTER — Other Ambulatory Visit (HOSPITAL_COMMUNITY): Payer: Self-pay

## 2021-02-22 DIAGNOSIS — Z1329 Encounter for screening for other suspected endocrine disorder: Secondary | ICD-10-CM | POA: Diagnosis not present

## 2021-02-22 DIAGNOSIS — Z13220 Encounter for screening for lipoid disorders: Secondary | ICD-10-CM | POA: Diagnosis not present

## 2021-02-22 DIAGNOSIS — Z0000 Encounter for general adult medical examination without abnormal findings: Secondary | ICD-10-CM | POA: Diagnosis not present

## 2021-02-22 DIAGNOSIS — I10 Essential (primary) hypertension: Secondary | ICD-10-CM | POA: Diagnosis not present

## 2021-02-22 DIAGNOSIS — R319 Hematuria, unspecified: Secondary | ICD-10-CM | POA: Diagnosis not present

## 2021-02-24 ENCOUNTER — Other Ambulatory Visit (HOSPITAL_COMMUNITY): Payer: Self-pay

## 2021-02-24 MED ORDER — POTASSIUM CHLORIDE CRYS ER 20 MEQ PO TBCR
20.0000 meq | EXTENDED_RELEASE_TABLET | Freq: Every day | ORAL | 0 refills | Status: DC
  Filled 2021-02-24: qty 5, 5d supply, fill #0

## 2021-03-16 ENCOUNTER — Other Ambulatory Visit (HOSPITAL_COMMUNITY): Payer: Self-pay

## 2021-03-16 DIAGNOSIS — R3912 Poor urinary stream: Secondary | ICD-10-CM | POA: Diagnosis not present

## 2021-03-16 DIAGNOSIS — R338 Other retention of urine: Secondary | ICD-10-CM | POA: Diagnosis not present

## 2021-03-16 DIAGNOSIS — N35013 Post-traumatic anterior urethral stricture: Secondary | ICD-10-CM | POA: Diagnosis not present

## 2021-03-16 DIAGNOSIS — N35914 Unspecified anterior urethral stricture, male: Secondary | ICD-10-CM | POA: Diagnosis not present

## 2021-03-16 DIAGNOSIS — Z6841 Body Mass Index (BMI) 40.0 and over, adult: Secondary | ICD-10-CM | POA: Diagnosis not present

## 2021-03-16 MED ORDER — SULFAMETHOXAZOLE-TRIMETHOPRIM 800-160 MG PO TABS
1.0000 | ORAL_TABLET | Freq: Two times a day (BID) | ORAL | 0 refills | Status: AC
  Filled 2021-03-16: qty 14, 7d supply, fill #0

## 2021-03-18 DIAGNOSIS — G4733 Obstructive sleep apnea (adult) (pediatric): Secondary | ICD-10-CM | POA: Diagnosis not present

## 2021-03-24 DIAGNOSIS — R600 Localized edema: Secondary | ICD-10-CM | POA: Diagnosis not present

## 2021-03-24 DIAGNOSIS — I872 Venous insufficiency (chronic) (peripheral): Secondary | ICD-10-CM | POA: Diagnosis not present

## 2021-04-28 DIAGNOSIS — I872 Venous insufficiency (chronic) (peripheral): Secondary | ICD-10-CM | POA: Diagnosis not present

## 2021-04-28 DIAGNOSIS — R600 Localized edema: Secondary | ICD-10-CM | POA: Diagnosis not present

## 2021-04-28 DIAGNOSIS — I10 Essential (primary) hypertension: Secondary | ICD-10-CM | POA: Diagnosis not present

## 2021-04-28 DIAGNOSIS — Z6841 Body Mass Index (BMI) 40.0 and over, adult: Secondary | ICD-10-CM | POA: Diagnosis not present

## 2021-05-03 ENCOUNTER — Other Ambulatory Visit (HOSPITAL_COMMUNITY): Payer: Self-pay

## 2021-05-03 DIAGNOSIS — J019 Acute sinusitis, unspecified: Secondary | ICD-10-CM | POA: Diagnosis not present

## 2021-05-03 MED ORDER — AZITHROMYCIN 250 MG PO TABS
ORAL_TABLET | ORAL | 0 refills | Status: AC
  Filled 2021-05-03: qty 6, 5d supply, fill #0

## 2021-05-19 ENCOUNTER — Other Ambulatory Visit (HOSPITAL_COMMUNITY): Payer: Self-pay

## 2021-06-10 DIAGNOSIS — G4733 Obstructive sleep apnea (adult) (pediatric): Secondary | ICD-10-CM | POA: Diagnosis not present

## 2021-09-10 ENCOUNTER — Other Ambulatory Visit (HOSPITAL_COMMUNITY): Payer: Self-pay

## 2021-12-30 ENCOUNTER — Other Ambulatory Visit (HOSPITAL_COMMUNITY): Payer: Self-pay

## 2023-04-01 ENCOUNTER — Encounter (HOSPITAL_COMMUNITY): Payer: Self-pay

## 2023-04-01 ENCOUNTER — Other Ambulatory Visit: Payer: Self-pay

## 2023-04-01 ENCOUNTER — Ambulatory Visit (HOSPITAL_COMMUNITY)
Admission: RE | Admit: 2023-04-01 | Discharge: 2023-04-01 | Disposition: A | Discharge status: Home / Self Care | Payer: Self-pay | Source: Ambulatory Visit | Attending: Physician Assistant | Admitting: Physician Assistant

## 2023-04-01 VITALS — BP 151/81 | HR 82 | Temp 98.3°F | Resp 18

## 2023-04-01 DIAGNOSIS — H9203 Otalgia, bilateral: Secondary | ICD-10-CM

## 2023-04-01 MED ORDER — FLUTICASONE PROPIONATE 50 MCG/ACT NA SUSP: 1.0000 | Freq: Every day | NASAL | 2 refills | Status: AC

## 2023-04-01 NOTE — ED Triage Notes (Signed)
 Pt reports persistent HA and bilateral ear pain for 2 weeks.

## 2023-04-01 NOTE — Discharge Instructions (Signed)
 Recommend resuming Allegra daily Can also use Flonase Continue to monitor blood pressure at home and return here if remains elevated or keep follow up with Primary Care Physician

## 2023-04-01 NOTE — ED Provider Notes (Signed)
 [facility]    CSN: [id number] Arrival date & time: 04/01/23  1429      History   Chief Complaint Chief Complaint  Patient presents with   Otalgia   Headache    HPI Jerry Butler is a 38 y.o. male.   Patient complains of intermittent headaches over the last few weeks with along with bilateral ear pain and pressure.  He denies congestion, cough, fever, chills.  He has tried nothing for the symptoms.  Elevated blood pressure readings today.  Patient reports about a year ago he was on blood pressure medications but is since stopped due to insurance.  He has been exercising and working out and has lost over 60 pounds over the last year.  He has not been checking his blood pressures at home recently.  He does have upcoming PCP appointment made.    Past Medical History:  Diagnosis Date   Hypertension    UTI (urinary tract infection)     There are no active problems to display for this patient.   Past Surgical History:  Procedure Laterality Date   CYSTOSCOPY         Home Medications    Prior to Admission medications   Medication Sig Start Date End Date Taking? Authorizing Provider  fluticasone (FLONASE) 50 MCG/ACT nasal spray Place 1 spray into both nostrils daily. 04/01/23  Yes Ward, Tylene Fantasia, PA-C  acetaminophen (TYLENOL) 500 MG tablet Take 1 tablet (500 mg total) by mouth every 6 (six) hours as needed. Patient not taking: No sig reported 07/10/19   Michela Pitcher A, PA-C  amLODipine (NORVASC) 5 MG tablet Take 5 mg by mouth daily. Patient not taking: No sig reported 02/19/19   [provider]  amlodipine-olmesartan (AZOR) 10-20 MG tablet TAKE 1 TABLET BY MOUTH DAILY. Patient not taking: No sig reported 02/20/20 02/19/21  Sherrie Mustache T, PA-C  amlodipine-olmesartan (AZOR) 10-20 MG tablet TAKE 1 TABLET BY MOUTH EVERY DAY Patient not taking: Reported on 10/26/2020 03/27/19 03/26/20  Lenord Fellers, PA-C  aspirin EC 81 MG tablet Take 81-162 mg by mouth as  needed (chest pain). Swallow whole.    [provider]  azithromycin (ZITHROMAX) 250 MG tablet Take two tablets by mouth on the first day and then one tablet daily thereafter 05/03/21     betamethasone dipropionate (DIPROLENE) 0.05 % ointment Apply 1 application topically 2 (two) times daily for 10 days 01/19/21     chlorthalidone (HYGROTON) 25 MG tablet Take 0.5 tablets (12.5 mg total) by mouth daily for 3 days, THEN 1 tablet (25 mg total) daily. 01/19/21 02/21/21    chlorthalidone (HYGROTON) 25 MG tablet Take 1 tablet (25 mg total) by mouth daily. 01/19/21     chlorthalidone (HYGROTON) 25 MG tablet Take 1 tablet (25 mg total) by mouth daily. 01/19/21     ciprofloxacin (CIPRO) 500 MG tablet Take 1 tablet (500 mg total) by mouth 2 (two) times daily for 10 days 11/30/20     doxycycline (VIBRAMYCIN) 100 MG capsule Take 1 capsule (100 mg total) by mouth 2 (two) times daily. 01/19/21     fluconazole (DIFLUCAN) 150 MG tablet Take 1 tablet (150 mg total) by mouth daily. 11/30/20     FLUoxetine (PROZAC) 10 MG capsule Take 1 capsule (10 mg total) by mouth every morning. 10/26/20     hydrochlorothiazide (HYDRODIURIL) 25 MG tablet TAKE 1 TABLET (25 MG TOTAL) BY MOUTH DAILY. Patient not taking: No sig reported 02/20/20 02/19/21  Virgilio Belling,  PA-C  hydrochlorothiazide (HYDRODIURIL) 25 MG tablet TAKE 1 TABLET BY MOUTH ONCE DAILY. Patient not taking: Reported on 10/26/2020 07/31/19 10/18/20  Sherrie Mustache T, PA-C  ibuprofen (ADVIL) 600 MG tablet Take 1 tablet (600 mg total) by mouth every 6 (six) hours as needed. Patient not taking: No sig reported 07/10/19   Michela Pitcher A, PA-C  NON FORMULARY CPAP at bedtime    [provider]  omeprazole (PRILOSEC) 20 MG capsule Take 1 capsule (20 mg total) by mouth daily. Patient taking differently: Take 20 mg by mouth daily as needed (heartburn). 07/17/19   O'Neal, Ronnald Ramp, MD  phenazopyridine (PYRIDIUM) 200 MG tablet Take 1 tablet (200 mg total) by  mouth 3 (three) times daily as needed for up to 3 days for pain( burning, bladder pain) 02/01/21     sulfamethoxazole-trimethoprim (BACTRIM DS) 800-160 MG tablet Take 1 tablet by mouth 2 (two) times daily for 7 days 03/16/21     potassium chloride SA (KLOR-CON M) 20 MEQ tablet Take 1 tablet (20 mEq total) by mouth daily for 5 days 02/23/21 05/03/21      Family History Family History  Problem Relation Age of Onset   Hypertension Mother     Social History Social History   Tobacco Use   Smoking status: Never   Smokeless tobacco: Never  Substance Use Topics   Alcohol use: No   Drug use: No     Allergies   Amoxicillin   Review of Systems Review of Systems  Constitutional:  Negative for chills and fever.  HENT:  Positive for ear pain. Negative for sore throat.   Eyes:  Negative for pain and visual disturbance.  Respiratory:  Negative for cough and shortness of breath.   Cardiovascular:  Negative for chest pain and palpitations.  Gastrointestinal:  Negative for abdominal pain and vomiting.  Genitourinary:  Negative for dysuria and hematuria.  Musculoskeletal:  Negative for arthralgias and back pain.  Skin:  Negative for color change and rash.  Neurological:  Positive for headaches. Negative for seizures and syncope.  All other systems reviewed and are negative.    Physical Exam Triage Vital Signs ED Triage Vitals [04/01/23 1445]  Encounter Vitals Group     BP (!) 151/81     Systolic BP Percentile      Diastolic BP Percentile      Pulse Rate 82     Resp 18     Temp 98.3 F (36.8 C)     Temp Source Oral     SpO2 99 %     Weight      Height      Head Circumference      Peak Flow      Pain Score 7     Pain Loc      Pain Education      Exclude from Growth Chart    No data found.  Updated Vital Signs BP (!) 151/81 (BP Location: Right Arm)   Pulse 82   Temp 98.3 F (36.8 C) (Oral)   Resp 18   SpO2 99%   Visual Acuity Right Eye Distance:   Left Eye Distance:    Bilateral Distance:    Right Eye Near:   Left Eye Near:    Bilateral Near:     Physical Exam Vitals and nursing note reviewed.  Constitutional:      General: He is not in acute distress.    Appearance: He is well-developed.  HENT:  Head: Normocephalic and atraumatic.  Eyes:     Conjunctiva/sclera: Conjunctivae normal.  Cardiovascular:     Rate and Rhythm: Normal rate and regular rhythm.     Heart sounds: No murmur heard. Pulmonary:     Effort: Pulmonary effort is normal. No respiratory distress.     Breath sounds: Normal breath sounds.  Abdominal:     Palpations: Abdomen is soft.     Tenderness: There is no abdominal tenderness.  Musculoskeletal:        General: No swelling.     Cervical back: Neck supple.  Skin:    General: Skin is warm and dry.     Capillary Refill: Capillary refill takes less than 2 seconds.  Neurological:     Mental Status: He is alert.  Psychiatric:        Mood and Affect: Mood normal.      UC Treatments / Results  Labs (all labs ordered are listed, but only abnormal results are displayed) Labs Reviewed - No data to display  EKG   Radiology No results found.  Procedures Procedures (including critical care time)  Medications Ordered in UC Medications - No data to display  Initial Impression / Assessment and Plan / UC Course  I have reviewed the triage vital signs and the nursing notes.  Pertinent labs & imaging results that were available during my care of the patient were reviewed by me and considered in my medical decision making (see chart for details).     Bilateral TMs normal in exams.  Recommend allergy medication and Flonase.  This may be contributing to his headache as well.  Elevated blood pressure reading in clinic today.  Advised patient to continue monitoring BP at home if it remains elevated to return to initiate BP medications.  If normal at home may continue with follow-up with PCP. Final Clinical Impressions(s)  / UC Diagnoses   Final diagnoses:  Otalgia of both ears     Discharge Instructions      Recommend resuming Allegra daily Can also use Flonase Continue to monitor blood pressure at home and return here if remains elevated or keep follow up with Primary Care Physician     ED Prescriptions     Medication Sig Dispense Auth. Provider   fluticasone (FLONASE) 50 MCG/ACT nasal spray Place 1 spray into both nostrils daily. 9.9 mL Ward, Tylene Fantasia, PA-C      PDMP not reviewed this encounter.   Ward, Tylene Fantasia, PA-C 04/01/23 [phone]

## 2023-04-10 IMAGING — CR DG CHEST 2V
2 series · 2 of 2 positions shown · non-contrast
Comparison: July 10, 2019

CLINICAL DATA: Left-sided chest pressure.

EXAM:
CHEST - 2 VIEW

[chest pa]
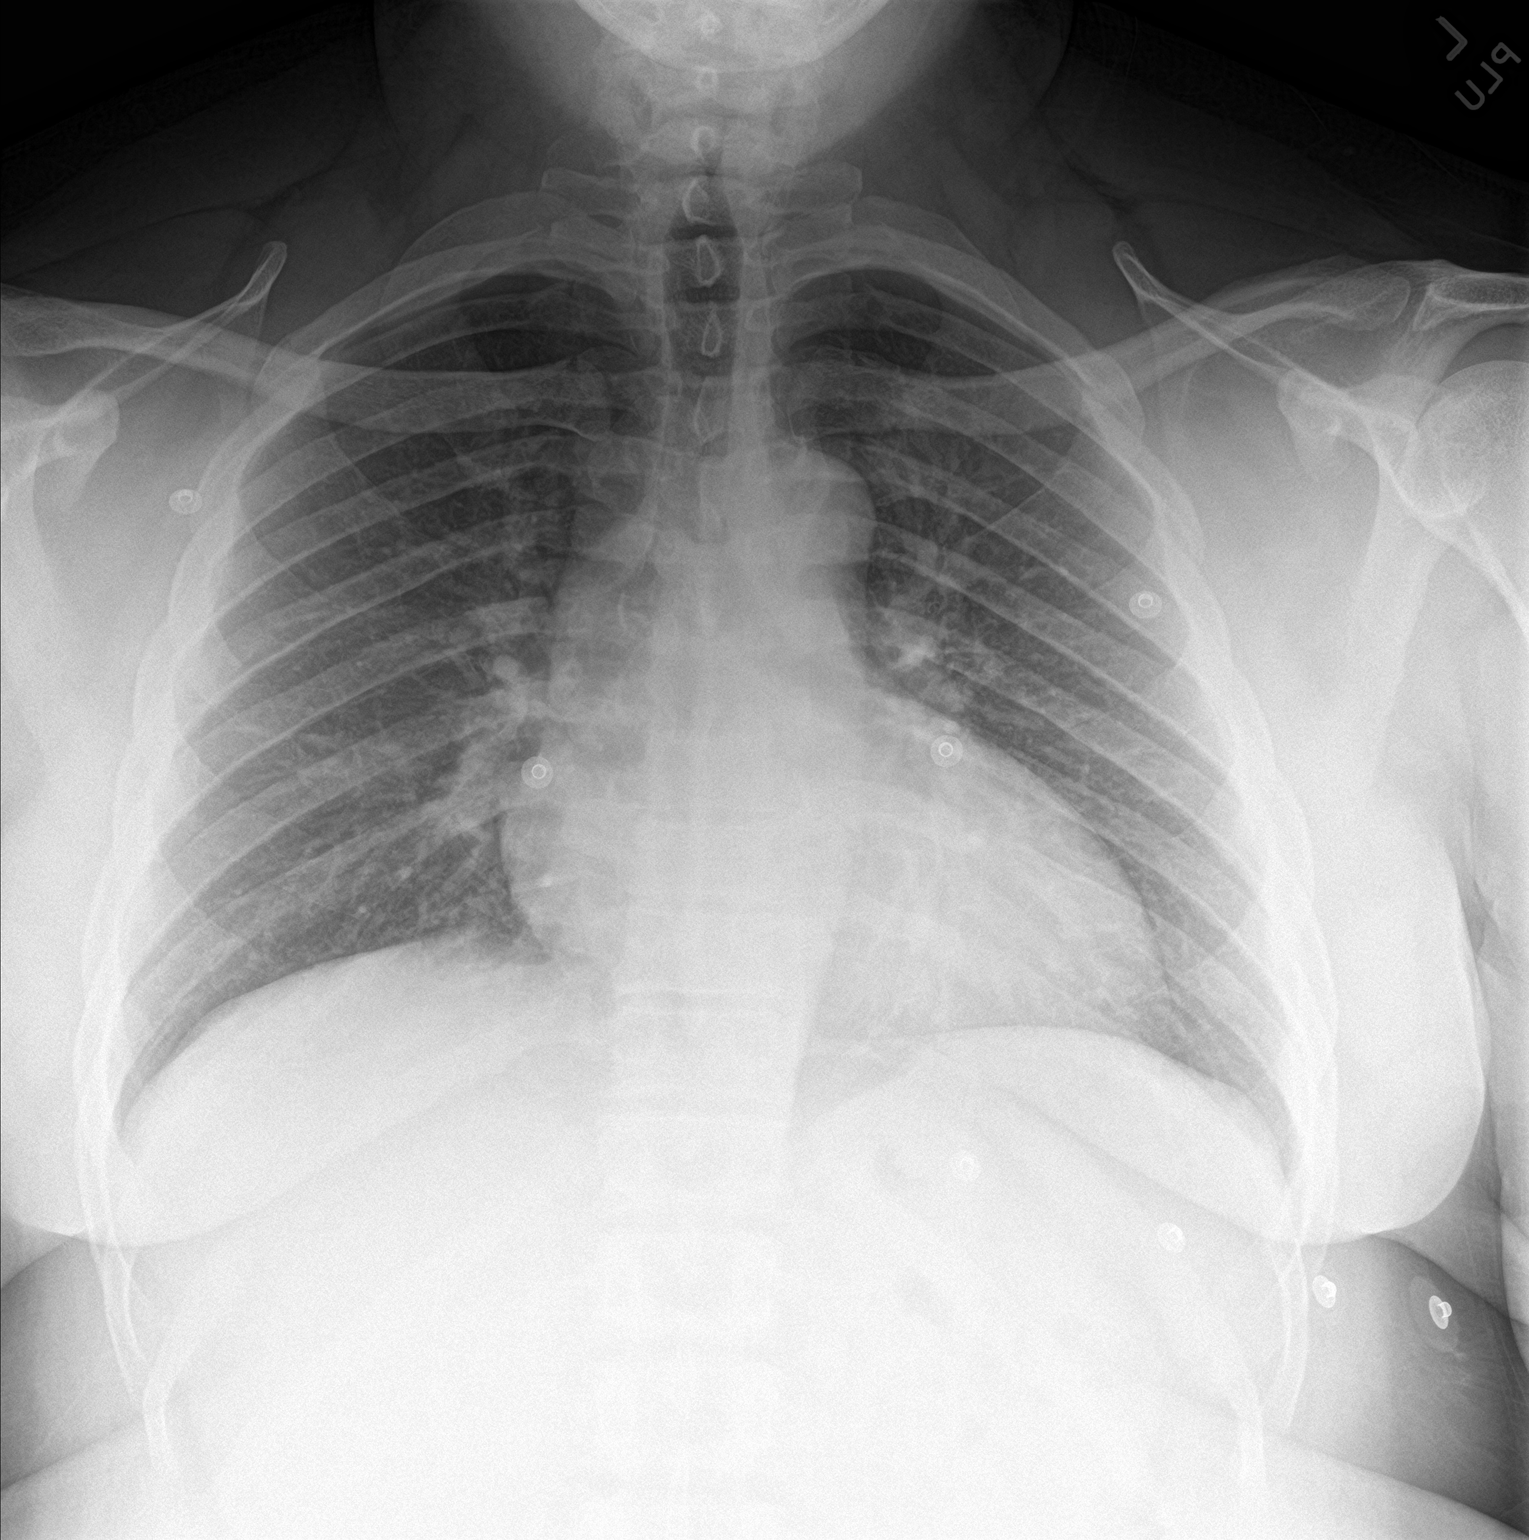

[chest lat]
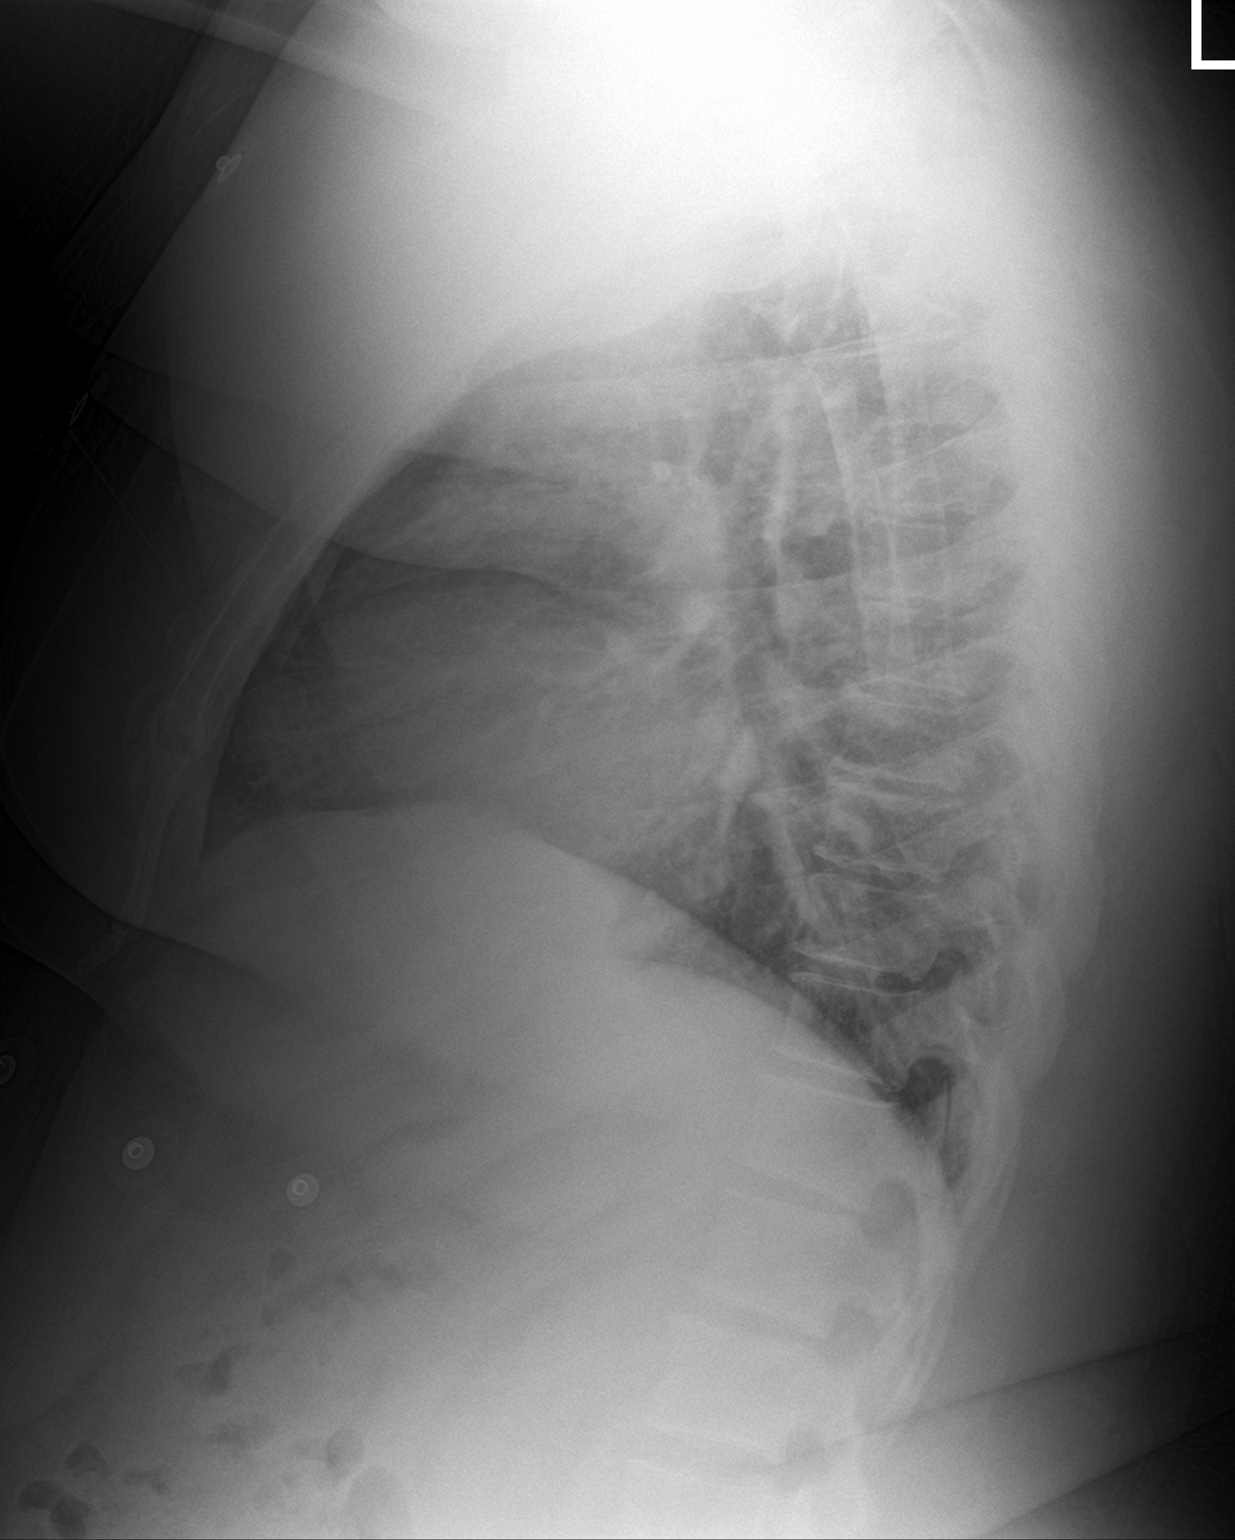

[2 of 2 positions shown; findings below may reference images not displayed]

FINDINGS: The heart size and mediastinal contours are within normal limits.
Both lungs are clear. The visualized skeletal structures are
unremarkable.
IMPRESSION: No active cardiopulmonary disease.
